# Patient Record
Sex: Female | Born: 2000 | Race: White | Hispanic: Yes | Marital: Single | Smoking: Never smoker
Health system: Southern US, Community
[De-identification: ages and names within clinical notes are randomized; demographics above are authoritative.]

---
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
## 2021-01-02 ENCOUNTER — Ambulatory Visit (INDEPENDENT_AMBULATORY_CARE_PROVIDER_SITE_OTHER): Payer: Medicaid Other | Admitting: Family Medicine

## 2021-01-02 ENCOUNTER — Other Ambulatory Visit: Payer: Self-pay

## 2021-01-02 VITALS — BP 108/59 | HR 79 | Wt 154.8 lb

## 2021-01-02 DIAGNOSIS — Z3492 Encounter for supervision of normal pregnancy, unspecified, second trimester: Secondary | ICD-10-CM

## 2021-01-02 DIAGNOSIS — Z98891 History of uterine scar from previous surgery: Secondary | ICD-10-CM

## 2021-01-02 NOTE — Progress Notes (Signed)
   PRENATAL VISIT NOTE  Subjective:  Annette Brock is a 20 y.o. G2P1001 at [redacted]w[redacted]d being seen today for ongoing prenatal care.  She is currently monitored for the following issues for this low-risk pregnancy and has Supervision of low-risk pregnancy and History of C-section on their problem list.  Patient reports no complaints.  Contractions: Not present. Vag. Bleeding: None.  Movement: Present. Denies leaking of fluid.   The following portions of the patient's history were reviewed and updated as appropriate: allergies, current medications, past family history, past medical history, past social history, past surgical history and problem list.   Objective:   Vitals:   01/02/21 1345  BP: (!) 108/59  Pulse: 79  Weight: 154 lb 12.8 oz (70.2 kg)    Fetal Status: Fetal Heart Rate (bpm): 154 Fundal Height: 22 cm Movement: Present     General:  Alert, oriented and cooperative. Patient is in no acute distress.  Skin: Skin is warm and dry. No rash noted.   Cardiovascular: Normal heart rate noted  Respiratory: Normal respiratory effort, no problems with respiration noted  Abdomen: Soft, gravid, appropriate for gestational age.  Pain/Pressure: Absent     Pelvic: Cervical exam deferred        Extremities: Normal range of motion.  Edema: None  Mental Status: Normal mood and affect. Normal behavior. Normal judgment and thought content.   Assessment and Plan:  Pregnancy: G2P1001 at [redacted]w[redacted]d 1. Encounter for supervision of low-risk pregnancy in second trimester Continue routine prenatal care.   2. History of C-section OP at 9 cm with NRFHR--desires TOLAC  General obstetric precautions including but not limited to vaginal bleeding, contractions, leaking of fluid and fetal movement were reviewed in detail with the patient. Please refer to After Visit Summary for other counseling recommendations.   Return in 4 weeks (on 01/30/2021) for virtual.  Future Appointments  Date Time Provider  Department Center  01/30/2021 10:35 AM Bernerd Limbo, CNM [facility]    Reva Bores, MD

## 2021-01-02 NOTE — Progress Notes (Signed)
Patient stated that she feels baby move and denies any pain or vaginal bleeding. No questions or concerns.   Dawayne Patricia, CMA   01/02/21

## 2021-01-02 NOTE — Patient Instructions (Signed)

## 2021-01-06 ENCOUNTER — Other Ambulatory Visit: Payer: Self-pay

## 2021-01-06 ENCOUNTER — Inpatient Hospital Stay (HOSPITAL_COMMUNITY)
Admission: AD | Admit: 2021-01-06 | Discharge: 2021-01-06 | Disposition: A | Discharge status: Home / Self Care | Payer: Medicaid Other | Attending: Obstetrics & Gynecology | Admitting: Obstetrics & Gynecology

## 2021-01-06 ENCOUNTER — Encounter (HOSPITAL_COMMUNITY): Payer: Self-pay | Admitting: Obstetrics & Gynecology

## 2021-01-06 DIAGNOSIS — O26892 Other specified pregnancy related conditions, second trimester: Secondary | ICD-10-CM | POA: Diagnosis present

## 2021-01-06 DIAGNOSIS — R1030 Lower abdominal pain, unspecified: Secondary | ICD-10-CM | POA: Insufficient documentation

## 2021-01-06 DIAGNOSIS — Z3A20 20 weeks gestation of pregnancy: Secondary | ICD-10-CM

## 2021-01-06 DIAGNOSIS — Z87891 Personal history of nicotine dependence: Secondary | ICD-10-CM | POA: Insufficient documentation

## 2021-01-06 DIAGNOSIS — Z8616 Personal history of COVID-19: Secondary | ICD-10-CM | POA: Insufficient documentation

## 2021-01-06 DIAGNOSIS — O2342 Unspecified infection of urinary tract in pregnancy, second trimester: Secondary | ICD-10-CM | POA: Diagnosis not present

## 2021-01-06 DIAGNOSIS — Z79899 Other long term (current) drug therapy: Secondary | ICD-10-CM | POA: Insufficient documentation

## 2021-01-06 DIAGNOSIS — R300 Dysuria: Secondary | ICD-10-CM | POA: Insufficient documentation

## 2021-01-06 DIAGNOSIS — R350 Frequency of micturition: Secondary | ICD-10-CM | POA: Diagnosis not present

## 2021-01-06 LAB — CBC WITH DIFFERENTIAL/PLATELET
Abs Immature Granulocytes: 0.07 10*3/uL (ref 0.00–0.07)
Basophils Absolute: 0.1 10*3/uL (ref 0.0–0.1)
Basophils Relative: 0 %
Eosinophils Absolute: 0.1 10*3/uL (ref 0.0–0.5)
Eosinophils Relative: 0 %
HCT: 35.1 % — ABNORMAL LOW (ref 36.0–46.0)
Hemoglobin: 11.9 g/dL — ABNORMAL LOW (ref 12.0–15.0)
Immature Granulocytes: 1 %
Lymphocytes Relative: 17 %
Lymphs Abs: 2.5 10*3/uL (ref 0.7–4.0)
MCH: 30.7 pg (ref 26.0–34.0)
MCHC: 33.9 g/dL (ref 30.0–36.0)
MCV: 90.5 fL (ref 80.0–100.0)
Monocytes Absolute: 1 10*3/uL (ref 0.1–1.0)
Monocytes Relative: 7 %
Neutro Abs: 10.9 10*3/uL — ABNORMAL HIGH (ref 1.7–7.7)
Neutrophils Relative %: 75 %
Platelets: 358 10*3/uL (ref 150–400)
RBC: 3.88 MIL/uL (ref 3.87–5.11)
RDW: 12.7 % (ref 11.5–15.5)
WBC: 14.5 10*3/uL — ABNORMAL HIGH (ref 4.0–10.5)
nRBC: 0 % (ref 0.0–0.2)

## 2021-01-06 LAB — URINALYSIS, ROUTINE W REFLEX MICROSCOPIC
Bilirubin Urine: NEGATIVE
Glucose, UA: NEGATIVE mg/dL
Hgb urine dipstick: NEGATIVE
Ketones, ur: NEGATIVE mg/dL
Nitrite: POSITIVE — AB
Protein, ur: 100 mg/dL — AB
Specific Gravity, Urine: 1.017 (ref 1.005–1.030)
WBC, UA: >50 WBC/hpf — ABNORMAL HIGH (ref 0–5)
pH: 7 (ref 5.0–8.0)

## 2021-01-06 MED ORDER — PHENAZOPYRIDINE HCL 200 MG PO TABS: 200.0000 mg | ORAL_TABLET | Freq: Three times a day (TID) | ORAL | 0 refills | Status: AC

## 2021-01-06 MED ORDER — ACETAMINOPHEN 500 MG PO TABS
1000.0000 mg | ORAL_TABLET | Freq: Once | ORAL | Status: AC
  Administered 2021-01-06: 1000 mg via ORAL
  Filled 2021-01-06: qty 2

## 2021-01-06 MED ORDER — CEFADROXIL 500 MG PO CAPS: 500.0000 mg | ORAL_CAPSULE | Freq: Two times a day (BID) | ORAL | 0 refills | Status: AC

## 2021-01-06 NOTE — MAU Provider Note (Signed)
History     CSN: [id number]  Arrival date and time: 01/06/21 1907   Event Date/Time   First Provider Initiated Contact with Patient 01/06/21 2012      Chief Complaint  Patient presents with   Dysuria    HPI Annette Brock is a 20 y.o. G2P1001 at 39w3dwho presents with flank pain and dysuria. She states it started yesterday and has progressively gotten worse. She rates the flank pain a 10/10 and has not tried anything for the pain. She also reports intermittent lower abdominal pain that she rates a 6/10. She reports she is having urinary frequency and feeling like she still needs to urinate immediately after she goes. She denies any bleeding or leaking. Reports feeling movement.   OB History     Gravida  2   Para  1   Term  1   Preterm      AB      Living  1      SAB      IAB      Ectopic      Multiple  0   Live Births  1           Past Medical History:  Diagnosis Date   Chlamydia infection affecting pregnancy 09/21/2020   COVID-19 02/2019   History of COVID-19 10/03/2020   02/2019    Past Surgical History:  Procedure Laterality Date   CESAREAN SECTION N/A 03/06/2019   Procedure: CESAREAN SECTION;  Surgeon: OJanyth Pupa DO;  [facility];  Service: Obstetrics;  Laterality: N/A;    Family History  Problem Relation Age of Onset   Hypertension Mother    Diabetes Mother     Social History   Tobacco Use   Smoking status: Never    Passive exposure: Yes   Smokeless tobacco: Never  Vaping Use   Vaping Use: Former   Quit date: 09/03/2020   Substances: Nicotine  Substance Use Topics   Alcohol use: No   Drug use: No    Allergies:  Allergies  Allergen Reactions   Mushroom Extract Complex     Medications Prior to Admission  Medication Sig Dispense Refill Last Dose   acetaminophen (TYLENOL) 325 MG tablet Take 2 tablets (650 mg total) by mouth every 6 (six) hours as needed for mild pain or moderate pain (temperature >  101.5.). (Patient not taking: Reported on 01/02/2021)      Blood Pressure Monitoring (BLOOD PRESSURE KIT) DEVI 1 Device by Does not apply route as needed. (Patient not taking: Reported on 01/02/2021) 1 each 0    Prenatal Vit-Fe Fumarate-FA (PRENATAL MULTIVITAMIN) TABS tablet Take 1 tablet by mouth daily at 12 noon.      promethazine (PHENERGAN) 25 MG tablet Take 1 tablet (25 mg total) by mouth every 6 (six) hours as needed for nausea or vomiting. 30 tablet 1     Review of Systems  Constitutional: Negative.  Negative for fatigue and fever.  HENT: Negative.    Respiratory: Negative.  Negative for shortness of breath.   Cardiovascular: Negative.  Negative for chest pain.  Gastrointestinal:  Positive for abdominal pain. Negative for constipation, diarrhea, nausea and vomiting.  Genitourinary:  Positive for dysuria, frequency and urgency. Negative for hematuria, vaginal bleeding and vaginal discharge.  Musculoskeletal:  Positive for back pain.  Neurological: Negative.  Negative for dizziness and headaches.  Physical Exam   Blood pressure 122/64, pulse 94, temperature 98.4 F (36.9 C), temperature source Oral, resp. rate  19, height 5' 3"  (1.6 m), weight 69.9 kg, last menstrual period 07/27/2020, SpO2 100 %, unknown if currently breastfeeding.  Physical Exam Vitals and nursing note reviewed.  Constitutional:      General: She is not in acute distress.    Appearance: She is well-developed.  HENT:     Head: Normocephalic.  Eyes:     Pupils: Pupils are equal, round, and reactive to light.  Cardiovascular:     Rate and Rhythm: Normal rate and regular rhythm.     Heart sounds: Normal heart sounds.  Pulmonary:     Effort: Pulmonary effort is normal. No respiratory distress.     Breath sounds: Normal breath sounds.  Abdominal:     General: Bowel sounds are normal. There is no distension.     Palpations: Abdomen is soft.     Tenderness: There is no abdominal tenderness. There is no right CVA  tenderness or left CVA tenderness.  Skin:    General: Skin is warm and dry.  Neurological:     Mental Status: She is alert and oriented to person, place, and time.  Psychiatric:        Mood and Affect: Mood normal.        Behavior: Behavior normal.        Thought Content: Thought content normal.        Judgment: Judgment normal.    FHT: 150 bpm  Cervix: closed/thick/posterior  MAU Course  Procedures Results for orders placed or performed during the hospital encounter of 01/06/21 (from the past 24 hour(s))  Urinalysis, Routine w reflex microscopic Urine, Clean Catch     Status: Abnormal   Collection Time: 01/06/21  8:01 PM  Result Value Ref Range   Color, Urine YELLOW YELLOW   APPearance CLOUDY (A) CLEAR   Specific Gravity, Urine 1.017 1.005 - 1.030   pH 7.0 5.0 - 8.0   Glucose, UA NEGATIVE NEGATIVE mg/dL   Hgb urine dipstick NEGATIVE NEGATIVE   Bilirubin Urine NEGATIVE NEGATIVE   Ketones, ur NEGATIVE NEGATIVE mg/dL   Protein, ur 100 (A) NEGATIVE mg/dL   Nitrite POSITIVE (A) NEGATIVE   Leukocytes,Ua LARGE (A) NEGATIVE   RBC / HPF 6-10 0 - 5 RBC/hpf   WBC, UA >50 (H) 0 - 5 WBC/hpf   Bacteria, UA MANY (A) NONE SEEN   Squamous Epithelial / LPF 6-10 0 - 5   WBC Clumps PRESENT   CBC with Differential/Platelet     Status: Abnormal   Collection Time: 01/06/21  8:46 PM  Result Value Ref Range   WBC 14.5 (H) 4.0 - 10.5 K/uL   RBC 3.88 3.87 - 5.11 MIL/uL   Hemoglobin 11.9 (L) 12.0 - 15.0 g/dL   HCT 35.1 (L) 36.0 - 46.0 %   MCV 90.5 80.0 - 100.0 fL   MCH 30.7 26.0 - 34.0 pg   MCHC 33.9 30.0 - 36.0 g/dL   RDW 12.7 11.5 - 15.5 %   Platelets 358 150 - 400 K/uL   nRBC 0.0 0.0 - 0.2 %   Neutrophils Relative % 75 %   Neutro Abs 10.9 (H) 1.7 - 7.7 K/uL   Lymphocytes Relative 17 %   Lymphs Abs 2.5 0.7 - 4.0 K/uL   Monocytes Relative 7 %   Monocytes Absolute 1.0 0.1 - 1.0 K/uL   Eosinophils Relative 0 %   Eosinophils Absolute 0.1 0.0 - 0.5 K/uL   Basophils Relative 0 %    Basophils Absolute 0.1 0.0 - 0.1 K/uL  Immature Granulocytes 1 %   Abs Immature Granulocytes 0.07 0.00 - 0.07 K/uL    MDM UA, UC Tylenol PO- patient reports improvement of pain CBC with Diff Cervix closed/thick, no signs of preterm labor  Assessment and Plan   1. Urinary tract infection in mother during second trimester of pregnancy   2. [redacted] weeks gestation of pregnancy    -Discharge home in stable condition -Rx for duracef and pyridium sent to patient's pharmacy -Pyelonephritis precautions discussed -Patient advised to follow-up with [facility] as scheduled for prenatal care -Patient may return to MAU as needed or if her condition were to change or worsen   Wende Mott CNM 01/06/2021, 8:12 PM

## 2021-01-06 NOTE — Discharge Instructions (Signed)

## 2021-01-06 NOTE — MAU Note (Signed)
..  Annette Brock is a 20 y.o. at [redacted]w[redacted]d here in MAU reporting: right sided back pain and lower abdominal pain that began yesterday and has gotten worse today. Has not taken anything for the pain. Back pain hurts more when she moves and feels sharp that is constant. The abdominal pain is constant that hurts more when she goes to the bathroom. She has had urinary frequency and pressure. +FM. Denies vaginal bleeding or leaking of fluid.  Pain score: back 10/10; abdominal 7/10 Vitals:   01/06/21 1946  BP: 122/64  Pulse: 94  Resp: 19  Temp: 98.4 F (36.9 C)  SpO2: 100%     FHT:150 Lab orders placed from triage: UA

## 2021-01-09 LAB — CULTURE, OB URINE: Culture: >=100000 — AB

## 2021-01-30 ENCOUNTER — Telehealth: Payer: Medicaid Other | Admitting: Certified Nurse Midwife

## 2021-02-25 ENCOUNTER — Other Ambulatory Visit: Payer: Self-pay | Admitting: *Deleted

## 2021-02-25 DIAGNOSIS — Z3490 Encounter for supervision of normal pregnancy, unspecified, unspecified trimester: Secondary | ICD-10-CM

## 2021-02-27 ENCOUNTER — Ambulatory Visit (INDEPENDENT_AMBULATORY_CARE_PROVIDER_SITE_OTHER): Payer: Medicaid Other | Admitting: Family Medicine

## 2021-02-27 ENCOUNTER — Other Ambulatory Visit: Payer: Medicaid Other

## 2021-02-27 ENCOUNTER — Encounter: Payer: Self-pay | Admitting: Family Medicine

## 2021-02-27 ENCOUNTER — Other Ambulatory Visit: Payer: Self-pay

## 2021-02-27 VITALS — BP 113/73 | HR 90 | Wt 172.9 lb

## 2021-02-27 DIAGNOSIS — Z23 Encounter for immunization: Secondary | ICD-10-CM | POA: Diagnosis not present

## 2021-02-27 DIAGNOSIS — Z3490 Encounter for supervision of normal pregnancy, unspecified, unspecified trimester: Secondary | ICD-10-CM

## 2021-02-27 DIAGNOSIS — Z98891 History of uterine scar from previous surgery: Secondary | ICD-10-CM

## 2021-02-27 DIAGNOSIS — Z3493 Encounter for supervision of normal pregnancy, unspecified, third trimester: Secondary | ICD-10-CM

## 2021-02-27 NOTE — Patient Instructions (Signed)

## 2021-02-27 NOTE — Progress Notes (Signed)
° °  Subjective:  Annette Brock is a 20 y.o. G2P1001 at [redacted]w[redacted]d being seen today for ongoing prenatal care.  She is currently monitored for the following issues for this low-risk pregnancy and has Supervision of low-risk pregnancy and History of C-section on their problem list.  Patient reports no complaints.  Contractions: Not present. Vag. Bleeding: None.  Movement: Present. Denies leaking of fluid.   The following portions of the patient's history were reviewed and updated as appropriate: allergies, current medications, past family history, past medical history, past social history, past surgical history and problem list. Problem list updated.  Objective:   Vitals:   02/27/21 0836  BP: 113/73  Pulse: 90  Weight: 172 lb 14.4 oz (78.4 kg)    Fetal Status: Fetal Heart Rate (bpm): 136   Movement: Present     General:  Alert, oriented and cooperative. Patient is in no acute distress.  Skin: Skin is warm and dry. No rash noted.   Cardiovascular: Normal heart rate noted  Respiratory: Normal respiratory effort, no problems with respiration noted  Abdomen: Soft, gravid, appropriate for gestational age. Pain/Pressure: Absent     Pelvic: Vag. Bleeding: None     Cervical exam deferred        Extremities: Normal range of motion.  Edema: None  Mental Status: Normal mood and affect. Normal behavior. Normal judgment and thought content.   Urinalysis:      Assessment and Plan:  Pregnancy: G2P1001 at [redacted]w[redacted]d  1. Encounter for supervision of low-risk pregnancy in third trimester BP and FHR normal 28 wk labs today TDaP given  2. History of C-section Desires TOLAC Consented, form signed  Preterm labor symptoms and general obstetric precautions including but not limited to vaginal bleeding, contractions, leaking of fluid and fetal movement were reviewed in detail with the patient. Please refer to After Visit Summary for other counseling recommendations.  Return in 2 weeks (on  03/13/2021) for [facility], ob visit.   Venora Maples, MD

## 2021-02-28 LAB — GLUCOSE TOLERANCE, 2 HOURS W/ 1HR
Glucose, 1 hour: 109 mg/dL (ref 70–179)
Glucose, 2 hour: 107 mg/dL (ref 70–152)
Glucose, Fasting: 82 mg/dL (ref 70–91)

## 2021-02-28 LAB — RPR: RPR Ser Ql: NONREACTIVE

## 2021-02-28 LAB — CBC
Hematocrit: 31.6 % — ABNORMAL LOW (ref 34.0–46.6)
Hemoglobin: 10.3 g/dL — ABNORMAL LOW (ref 11.1–15.9)
MCH: 29 pg (ref 26.6–33.0)
MCHC: 32.6 g/dL (ref 31.5–35.7)
MCV: 89 fL (ref 79–97)
Platelets: 398 10*3/uL (ref 150–450)
RBC: 3.55 x10E6/uL — ABNORMAL LOW (ref 3.77–5.28)
RDW: 12.3 % (ref 11.7–15.4)
WBC: 9.9 10*3/uL (ref 3.4–10.8)

## 2021-02-28 LAB — HIV ANTIBODY (ROUTINE TESTING W REFLEX): HIV Screen 4th Generation wRfx: NONREACTIVE

## 2021-03-13 ENCOUNTER — Ambulatory Visit (INDEPENDENT_AMBULATORY_CARE_PROVIDER_SITE_OTHER): Payer: Medicaid Other | Admitting: Certified Nurse Midwife

## 2021-03-13 ENCOUNTER — Other Ambulatory Visit: Payer: Self-pay

## 2021-03-13 VITALS — BP 126/67 | HR 89 | Wt 174.0 lb

## 2021-03-13 DIAGNOSIS — Z3493 Encounter for supervision of normal pregnancy, unspecified, third trimester: Secondary | ICD-10-CM

## 2021-03-13 DIAGNOSIS — Z3A29 29 weeks gestation of pregnancy: Secondary | ICD-10-CM

## 2021-03-13 DIAGNOSIS — Z98891 History of uterine scar from previous surgery: Secondary | ICD-10-CM

## 2021-03-13 NOTE — Progress Notes (Signed)
° °  PRENATAL VISIT NOTE  Subjective:  Annette Brock is a 20 y.o. G2P1001 at [redacted]w[redacted]d being seen today for ongoing prenatal care.  She is currently monitored for the following issues for this low-risk pregnancy and has Supervision of low-risk pregnancy and History of C-section on their problem list.  Patient reports no complaints. Feels like the baby turned to head down in the past week. Feeling more kicks in her upper abdomen, more pressure in her pelvis and able to breathe better. Contractions: Not present. Vag. Bleeding: None.  Movement: Present. Denies leaking of fluid.   The following portions of the patient's history were reviewed and updated as appropriate: allergies, current medications, past family history, past medical history, past social history, past surgical history and problem list.   Objective:   Vitals:   03/13/21 0835  BP: 126/67  Pulse: 89  Weight: 174 lb (78.9 kg)   Fetal Status: Fetal Heart Rate (bpm): 135 Fundal Height: 30 cm Movement: Present  Presentation: Vertex  General:  Alert, oriented and cooperative. Patient is in no acute distress.  Skin: Skin is warm and dry. No rash noted.   Cardiovascular: Normal heart rate noted  Respiratory: Normal respiratory effort, no problems with respiration noted  Abdomen: Soft, gravid, appropriate for gestational age.  Pain/Pressure: Present     Pelvic: Cervical exam deferred      Leopold's = baby vertex  Extremities: Normal range of motion.  Edema: None  Mental Status: Normal mood and affect. Normal behavior. Normal judgment and thought content.   Assessment and Plan:  Pregnancy: G2P1001 at [redacted]w[redacted]d 1. Supervision of low-risk pregnancy, third trimester - Doing well, feeling regular and vigorous fetal movement   2. [redacted] weeks gestation of pregnancy - Routine OB care, reviewed third trimester labs  3. History of cesarean section - Planning TOLAC, signed consent with MD already  Preterm labor symptoms and general  obstetric precautions including but not limited to vaginal bleeding, contractions, leaking of fluid and fetal movement were reviewed in detail with the patient. Please refer to After Visit Summary for other counseling recommendations.   Return in about 2 weeks (around 03/27/2021) for LOB, IN-PERSON.  Future Appointments  Date Time Provider Department Center  03/26/2021  2:55 PM Crissie Reese, Mary Sella, MD [facility]    Bernerd Limbo, CNM

## 2021-03-13 NOTE — Progress Notes (Signed)
Patient reports "off and on" pelvis pressure

## 2021-03-17 NOTE — L&D Delivery Note (Signed)
OB/GYN Faculty Practice Delivery Note  Annette Brock is a 21 y.o. [id number] s/p SVD at [redacted]w[redacted]d. She was admitted for SOL/TOLAC.   ROM: 4h 19m with clear fluid GBS Status:  Negative/-- (02/09 1615) Maximum Maternal Temperature: 98.71F  Labor Progress: Initial SVE: 3.5/50/-2. She then progressed to complete with assistance of AROM.   Delivery Date/Time: 2/24 at 1106  Delivery: Called to room and patient was complete and pushing. Head delivered midline OA. Loose nuchal cord present and delivered through. Shoulder and body delivered in usual fashion. Infant with spontaneous cry, placed on mother's abdomen, dried and stimulated. Cord clamped x 2 after 1-minute delay, and cut by FOB. Cord blood drawn. Placenta delivered spontaneously with gentle cord traction. Fundus firm with massage and Pitocin. Labia, perineum, vagina, and cervix inspected with bilateral peri-urethral lacerations with the right including a superior labial extension. The R laceration was repaired with 4.0 monocryl in usual fashion. L was not repaired and hemostatic. Urinary catheter was in place during repair and easily removed afterwards. Delivery was done in conjunction with Dr. Levin Erp, MD [facility] PGY-1.  Baby Weight: pending  Placenta: 3 vessel, intact. Sent to L&D Complications: None Lacerations: bilateral  EBL: 224 mL Analgesia: Epidural   Infant:  APGAR (1 MIN): 9   APGAR (5 MINS): 9    Leticia Penna, DO  OB Family Medicine Fellow, [facility], [facility] 05/10/2021, 12:53 PM

## 2021-03-26 ENCOUNTER — Encounter: Payer: Medicaid Other | Admitting: Family Medicine

## 2021-04-04 ENCOUNTER — Other Ambulatory Visit: Payer: Self-pay

## 2021-04-04 ENCOUNTER — Ambulatory Visit (INDEPENDENT_AMBULATORY_CARE_PROVIDER_SITE_OTHER): Payer: Medicaid Other | Admitting: Family Medicine

## 2021-04-04 VITALS — BP 127/61 | HR 111 | Wt 177.8 lb

## 2021-04-04 DIAGNOSIS — Z98891 History of uterine scar from previous surgery: Secondary | ICD-10-CM

## 2021-04-04 DIAGNOSIS — Z3493 Encounter for supervision of normal pregnancy, unspecified, third trimester: Secondary | ICD-10-CM

## 2021-04-04 DIAGNOSIS — F5089 Other specified eating disorder: Secondary | ICD-10-CM

## 2021-04-04 NOTE — Progress Notes (Signed)
° °  PRENATAL VISIT NOTE  Subjective:  Annette Brock is a 21 y.o. G2P1001 at [redacted]w[redacted]d being seen today for ongoing prenatal care.  She is currently monitored for the following issues for this low-risk pregnancy and has Supervision of low-risk pregnancy and History of C-section on their problem list.  Patient reports no complaints.  Contractions: Not present. Vag. Bleeding: None.  Movement: Present. Denies leaking of fluid.   The following portions of the patient's history were reviewed and updated as appropriate: allergies, current medications, past family history, past medical history, past social history, past surgical history and problem list.   Objective:   Vitals:   04/04/21 1400  BP: 127/61  Pulse: (!) 111  Weight: 177 lb 12.8 oz (80.6 kg)    Fetal Status: Fetal Heart Rate (bpm): 143 Fundal Height: 32 cm Movement: Present     General:  Alert, oriented and cooperative. Patient is in no acute distress.  Skin: Skin is warm and dry. No rash noted.   Cardiovascular: Normal heart rate noted  Respiratory: Normal respiratory effort, no problems with respiration noted  Abdomen: Soft, gravid, appropriate for gestational age.  Pain/Pressure: Present     Pelvic: Cervical exam deferred        Extremities: Normal range of motion.  Edema: None  Mental Status: Normal mood and affect. Normal behavior. Normal judgment and thought content.   Assessment and Plan:  Pregnancy: G2P1001 at [redacted]w[redacted]d 1. Encounter for supervision of low-risk pregnancy in third trimester Continue routine prenatal care.  2. History of C-section For TOLAC  3. Pica Eating a lot of ice--10.3 at last check, recheck in case needs additional oral or IV supplementation - CBC  Preterm labor symptoms (reviewed at length) and general obstetric precautions including but not limited to vaginal bleeding, contractions, leaking of fluid and fetal movement were reviewed in detail with the patient. Please refer to After Visit  Summary for other counseling recommendations.   Return in 2 weeks (on 04/18/2021) for [facility].  No future appointments.  Reva Bores, MD

## 2021-04-04 NOTE — Patient Instructions (Signed)

## 2021-04-05 LAB — CBC
Hematocrit: 30.7 % — ABNORMAL LOW (ref 34.0–46.6)
Hemoglobin: 10.2 g/dL — ABNORMAL LOW (ref 11.1–15.9)
MCH: 27.3 pg (ref 26.6–33.0)
MCHC: 33.2 g/dL (ref 31.5–35.7)
MCV: 82 fL (ref 79–97)
Platelets: 391 10*3/uL (ref 150–450)
RBC: 3.73 x10E6/uL — ABNORMAL LOW (ref 3.77–5.28)
RDW: 13.2 % (ref 11.7–15.4)
WBC: 9.6 10*3/uL (ref 3.4–10.8)

## 2021-04-10 ENCOUNTER — Encounter: Payer: Self-pay | Admitting: Family Medicine

## 2021-04-11 ENCOUNTER — Inpatient Hospital Stay (HOSPITAL_COMMUNITY)
Admission: AD | Admit: 2021-04-11 | Discharge: 2021-04-11 | Disposition: A | Discharge status: Home / Self Care | Payer: Medicaid Other | Attending: Obstetrics and Gynecology | Admitting: Obstetrics and Gynecology

## 2021-04-11 ENCOUNTER — Other Ambulatory Visit: Payer: Self-pay

## 2021-04-11 ENCOUNTER — Encounter (HOSPITAL_COMMUNITY): Payer: Self-pay | Admitting: Obstetrics and Gynecology

## 2021-04-11 DIAGNOSIS — Z3A34 34 weeks gestation of pregnancy: Secondary | ICD-10-CM | POA: Diagnosis not present

## 2021-04-11 DIAGNOSIS — O2343 Unspecified infection of urinary tract in pregnancy, third trimester: Secondary | ICD-10-CM | POA: Insufficient documentation

## 2021-04-11 DIAGNOSIS — N390 Urinary tract infection, site not specified: Secondary | ICD-10-CM | POA: Diagnosis not present

## 2021-04-11 LAB — URINALYSIS, ROUTINE W REFLEX MICROSCOPIC
Bilirubin Urine: NEGATIVE
Glucose, UA: NEGATIVE mg/dL
Hgb urine dipstick: NEGATIVE
Ketones, ur: 80 mg/dL — AB
Nitrite: POSITIVE — AB
Protein, ur: NEGATIVE mg/dL
Specific Gravity, Urine: 1.016 (ref 1.005–1.030)
WBC, UA: >50 WBC/hpf — ABNORMAL HIGH (ref 0–5)
pH: 6 (ref 5.0–8.0)

## 2021-04-11 MED ORDER — CEFADROXIL 500 MG PO CAPS: 500.0000 mg | ORAL_CAPSULE | Freq: Two times a day (BID) | ORAL | 0 refills | Status: AC

## 2021-04-11 MED ORDER — PHENAZOPYRIDINE HCL 200 MG PO TABS: 200.0000 mg | ORAL_TABLET | Freq: Three times a day (TID) | ORAL | 0 refills | Status: AC

## 2021-04-11 NOTE — MAU Note (Signed)
...  Annette Brock is a 21 y.o. at [redacted]w[redacted]d here in MAU reporting: CTX since Tuesday that she states slowed down yesterday but have picked up today. She states her CTX are currently 5-6 minutes apart. She has been trying to stay hydrated and has taken Tylenol but has not had any relief. No VB or LOF. Endorses decreased fetal movement since Tuesday.   Last dose: 1000 mg of Tylenol around noon  Pain score:  6/10 lower abdomen  FHT: 155 initial external Lab orders placed from triage: UA

## 2021-04-11 NOTE — Discharge Instructions (Signed)

## 2021-04-11 NOTE — MAU Provider Note (Signed)
History     CSN: [id number]  Arrival date and time: 04/11/21 1626   Event Date/Time   First Provider Initiated Contact with Patient 04/11/21 1659      Chief Complaint  Patient presents with   Contractions   HPI Annette Brock is a 21 y.o. G2P1001 at 30w0dwho presents with lower abdominal cramping for the last couple of weeks. She states it has been consistent in the level of pain. She rates the pain a 4/10. She denies any bleeding or leaking. She reports normal fetal movement. She denies any constipation or diarrhea. She denies any urinary complaints.   OB History     Gravida  2   Para  1   Term  1   Preterm      AB      Living  1      SAB      IAB      Ectopic      Multiple  0   Live Births  1           Past Medical History:  Diagnosis Date   Chlamydia infection affecting pregnancy 09/21/2020   COVID-19 02/2019   History of COVID-19 10/03/2020   02/2019    Past Surgical History:  Procedure Laterality Date   CESAREAN SECTION N/A 03/06/2019   Procedure: CESAREAN SECTION;  Surgeon: OJanyth Pupa DO;  [facility];  Service: Obstetrics;  Laterality: N/A;    Family History  Problem Relation Age of Onset   Hypertension Mother    Diabetes Mother     Social History   Tobacco Use   Smoking status: Never    Passive exposure: Yes   Smokeless tobacco: Never  Vaping Use   Vaping Use: Former   Quit date: 09/03/2020   Substances: Nicotine  Substance Use Topics   Alcohol use: No   Drug use: No    Allergies:  Allergies  Allergen Reactions   Mushroom Extract Complex     Medications Prior to Admission  Medication Sig Dispense Refill Last Dose   acetaminophen (TYLENOL) 500 MG tablet Take 500 mg by mouth every 6 (six) hours as needed.   04/11/2021 at 1200   Prenatal Vit-Fe Fumarate-FA (PRENATAL MULTIVITAMIN) TABS tablet Take 1 tablet by mouth daily at 12 noon.   04/10/2021   Blood Pressure Monitoring (BLOOD PRESSURE KIT) DEVI 1  Device by Does not apply route as needed. (Patient not taking: Reported on 01/02/2021) 1 each 0     Review of Systems  Constitutional: Negative.  Negative for fatigue and fever.  HENT: Negative.    Respiratory: Negative.  Negative for shortness of breath.   Cardiovascular: Negative.  Negative for chest pain.  Gastrointestinal:  Positive for abdominal pain. Negative for constipation, diarrhea, nausea and vomiting.  Genitourinary: Negative.  Negative for dysuria, vaginal bleeding and vaginal discharge.  Neurological: Negative.  Negative for dizziness and headaches.  Physical Exam   Blood pressure 117/66, pulse 98, temperature 98.1 F (36.7 C), temperature source Oral, resp. rate 15, last menstrual period 07/27/2020, SpO2 98 %, unknown if currently breastfeeding.  Physical Exam Vitals and nursing note reviewed.  Constitutional:      General: She is not in acute distress.    Appearance: She is well-developed.  HENT:     Head: Normocephalic.  Eyes:     Pupils: Pupils are equal, round, and reactive to light.  Cardiovascular:     Rate and Rhythm: Normal rate and regular rhythm.  Heart sounds: Normal heart sounds.  Pulmonary:     Effort: Pulmonary effort is normal. No respiratory distress.     Breath sounds: Normal breath sounds.  Abdominal:     General: Bowel sounds are normal. There is no distension.     Palpations: Abdomen is soft.     Tenderness: There is no abdominal tenderness.  Skin:    General: Skin is warm and dry.  Neurological:     Mental Status: She is alert and oriented to person, place, and time.  Psychiatric:        Mood and Affect: Mood normal.        Behavior: Behavior normal.        Thought Content: Thought content normal.        Judgment: Judgment normal.   Fetal Tracing:  Baseline: 130 Variability: moderate Accels: 15x15 Decels: none  Toco: none  Dilation: Closed Effacement (%): Thick Exam by:: Sharolyn Douglas, CNM  MAU Course   Procedures Results for orders placed or performed during the hospital encounter of 04/11/21 (from the past 24 hour(s))  Urinalysis, Routine w reflex microscopic Urine, Clean Catch     Status: Abnormal   Collection Time: 04/11/21  4:59 PM  Result Value Ref Range   Color, Urine YELLOW YELLOW   APPearance HAZY (A) CLEAR   Specific Gravity, Urine 1.016 1.005 - 1.030   pH 6.0 5.0 - 8.0   Glucose, UA NEGATIVE NEGATIVE mg/dL   Hgb urine dipstick NEGATIVE NEGATIVE   Bilirubin Urine NEGATIVE NEGATIVE   Ketones, ur 80 (A) NEGATIVE mg/dL   Protein, ur NEGATIVE NEGATIVE mg/dL   Nitrite POSITIVE (A) NEGATIVE   Leukocytes,Ua LARGE (A) NEGATIVE   RBC / HPF 0-5 0 - 5 RBC/hpf   WBC, UA >50 (H) 0 - 5 WBC/hpf   Bacteria, UA MANY (A) NONE SEEN   Squamous Epithelial / LPF 0-5 0 - 5   Mucus PRESENT     MDM UA, UC PO hydration  Assessment and Plan   1. Urinary tract infection in mother during third trimester of pregnancy   2. [redacted] weeks gestation of pregnancy    -Discharge home in stable condition -RX for duracef and pyridium sent to patient's pharmacy -Pyelonephritis precautions discussed -Patient advised to follow-up with OB as scheduled for prenatal care -Patient may return to MAU as needed or if her condition were to change or worsen   Wende Mott CNM 04/11/2021, 4:59 PM

## 2021-04-14 LAB — CULTURE, OB URINE: Culture: >=100000 — AB

## 2021-04-18 ENCOUNTER — Ambulatory Visit (INDEPENDENT_AMBULATORY_CARE_PROVIDER_SITE_OTHER): Payer: Medicaid Other

## 2021-04-18 ENCOUNTER — Other Ambulatory Visit: Payer: Self-pay

## 2021-04-18 VITALS — BP 123/71 | HR 102 | Wt 183.4 lb

## 2021-04-18 DIAGNOSIS — Z98891 History of uterine scar from previous surgery: Secondary | ICD-10-CM

## 2021-04-18 DIAGNOSIS — Z3493 Encounter for supervision of normal pregnancy, unspecified, third trimester: Secondary | ICD-10-CM

## 2021-04-18 DIAGNOSIS — Z3A35 35 weeks gestation of pregnancy: Secondary | ICD-10-CM

## 2021-04-18 NOTE — Progress Notes (Signed)
° °  PRENATAL VISIT NOTE  Subjective:  Annette Brock is a 21 y.o. G2P1001 at [redacted]w[redacted]d being seen today for ongoing prenatal care.  She is currently monitored for the following issues for this low-risk pregnancy and has Supervision of low-risk pregnancy and History of C-section on their problem list.  Patient reports no complaints.  Contractions: Not present. Vag. Bleeding: None.  Movement: Present. Denies leaking of fluid.   The following portions of the patient's history were reviewed and updated as appropriate: allergies, current medications, past family history, past medical history, past social history, past surgical history and problem list.   Objective:   Vitals:   04/18/21 1125  BP: 123/71  Pulse: (!) 102  Weight: 183 lb 6.4 oz (83.2 kg)    Fetal Status: Fetal Heart Rate (bpm): 143 Fundal Height: 36 cm Movement: Present     General:  Alert, oriented and cooperative. Patient is in no acute distress.  Skin: Skin is warm and dry. No rash noted.   Cardiovascular: Normal heart rate noted  Respiratory: Normal respiratory effort, no problems with respiration noted  Abdomen: Soft, gravid, appropriate for gestational age.  Pain/Pressure: Present     Pelvic: Cervical exam deferred        Extremities: Normal range of motion.  Edema: None  Mental Status: Normal mood and affect. Normal behavior. Normal judgment and thought content.   Assessment and Plan:  Pregnancy: G2P1001 at [redacted]w[redacted]d 1. Encounter for supervision of low-risk pregnancy in third trimester - Routine OB. Doing well. No concerns - Cultures next week - Anticipatory guidance for upcoming appointments provided  2. [redacted] weeks gestation of pregnancy   3. History of C-section - TOLAC consent signed   Preterm labor symptoms and general obstetric precautions including but not limited to vaginal bleeding, contractions, leaking of fluid and fetal movement were reviewed in detail with the patient. Please refer to After Visit  Summary for other counseling recommendations.   Return in about 1 week (around 04/25/2021).  Future Appointments  Date Time Provider Department Center  04/25/2021  3:15 PM Marylene Land, CNM [facility]    Brand Males, CNM

## 2021-04-25 ENCOUNTER — Other Ambulatory Visit (HOSPITAL_COMMUNITY)
Admission: RE | Admit: 2021-04-25 | Discharge: 2021-04-25 | Disposition: A | Discharge status: Home / Self Care | Payer: Medicaid Other | Source: Ambulatory Visit | Attending: Student | Admitting: Student

## 2021-04-25 ENCOUNTER — Ambulatory Visit (INDEPENDENT_AMBULATORY_CARE_PROVIDER_SITE_OTHER): Payer: Medicaid Other | Admitting: Student

## 2021-04-25 ENCOUNTER — Other Ambulatory Visit: Payer: Self-pay

## 2021-04-25 VITALS — BP 122/67 | HR 91 | Wt 182.0 lb

## 2021-04-25 DIAGNOSIS — Z3493 Encounter for supervision of normal pregnancy, unspecified, third trimester: Secondary | ICD-10-CM | POA: Diagnosis present

## 2021-04-25 DIAGNOSIS — Z3A36 36 weeks gestation of pregnancy: Secondary | ICD-10-CM

## 2021-04-25 LAB — OB RESULTS CONSOLE GC/CHLAMYDIA: Gonorrhea: NEGATIVE

## 2021-04-25 NOTE — Progress Notes (Signed)
° °  PRENATAL VISIT NOTE  Subjective:  Annette Brock is a 21 y.o. G2P1001 at [redacted]w[redacted]d being seen today for ongoing prenatal care.  She is currently monitored for the following issues for this low-risk pregnancy and has Supervision of low-risk pregnancy and History of C-section on their problem list.  Patient reports no complaints.  Contractions: Irritability. Vag. Bleeding: None.  Movement: Present. Denies leaking of fluid.   The following portions of the patient's history were reviewed and updated as appropriate: allergies, current medications, past family history, past medical history, past social history, past surgical history and problem list.   Objective:   Vitals:   04/25/21 1526  BP: 122/67  Pulse: 91  Weight: 182 lb (82.6 kg)    Fetal Status: Fetal Heart Rate (bpm): 157 Fundal Height: 37 cm Movement: Present  Presentation: Vertex  General:  Alert, oriented and cooperative. Patient is in no acute distress.  Skin: Skin is warm and dry. No rash noted.   Cardiovascular: Normal heart rate noted  Respiratory: Normal respiratory effort, no problems with respiration noted  Abdomen: Soft, gravid, appropriate for gestational age.  Pain/Pressure: Present     Pelvic: Cervical exam performed in the presence of a chaperone Dilation: 1      Extremities: Normal range of motion.  Edema: Trace  Mental Status: Normal mood and affect. Normal behavior. Normal judgment and thought content.   Assessment and Plan:  Pregnancy: G2P1001 at [redacted]w[redacted]d 1. Encounter for supervision of low-risk pregnancy in third trimester -still wants to try vaginal birth; she has signed VBAC form -confirmed vertex by [place] -recommend kick counts if patient is ever concerned about fetal movements.  - GC/Chlamydia probe amp ([facility])not at [facility] - Culture, beta strep (group b only) -discussed birth control patch; she has used in the past and wants to try Preterm labor symptoms and general obstetric precautions  including but not limited to vaginal bleeding, contractions, leaking of fluid and fetal movement were reviewed in detail with the patient. Please refer to After Visit Summary for other counseling recommendations.   Return in about 2 weeks (around 05/09/2021), or LROB with KK if possible.  No future appointments.  Marylene Land, CNM

## 2021-04-26 LAB — GC/CHLAMYDIA PROBE AMP (~~LOC~~) NOT AT ARMC
Chlamydia: NEGATIVE
Comment: NEGATIVE
Comment: NORMAL
Neisseria Gonorrhea: NEGATIVE

## 2021-04-29 LAB — CULTURE, BETA STREP (GROUP B ONLY): Strep Gp B Culture: NEGATIVE

## 2021-05-09 ENCOUNTER — Ambulatory Visit (INDEPENDENT_AMBULATORY_CARE_PROVIDER_SITE_OTHER): Payer: Medicaid Other

## 2021-05-09 ENCOUNTER — Other Ambulatory Visit: Payer: Self-pay

## 2021-05-09 VITALS — BP 121/75 | HR 90 | Wt 184.3 lb

## 2021-05-09 DIAGNOSIS — Z3493 Encounter for supervision of normal pregnancy, unspecified, third trimester: Secondary | ICD-10-CM

## 2021-05-09 DIAGNOSIS — Z3A38 38 weeks gestation of pregnancy: Secondary | ICD-10-CM

## 2021-05-09 DIAGNOSIS — Z98891 History of uterine scar from previous surgery: Secondary | ICD-10-CM

## 2021-05-09 NOTE — Progress Notes (Signed)
° °  PRENATAL VISIT NOTE  Subjective:  Annette Brock is a 21 y.o. G2P1001 at [redacted]w[redacted]d being seen today for ongoing prenatal care.  She is currently monitored for the following issues for this low-risk pregnancy and has Supervision of low-risk pregnancy and History of C-section on their problem list.  Patient reports  braxton hicks contractions .  Contractions: Irritability. Vag. Bleeding: None.  Movement: Present. Denies leaking of fluid.   The following portions of the patient's history were reviewed and updated as appropriate: allergies, current medications, past family history, past medical history, past social history, past surgical history and problem list.   Objective:   Vitals:   05/09/21 1606  BP: 121/75  Pulse: 90  Weight: 184 lb 4.8 oz (83.6 kg)    Fetal Status: Fetal Heart Rate (bpm): 133   Movement: Present  Presentation: Vertex  General:  Alert, oriented and cooperative. Patient is in no acute distress.  Skin: Skin is warm and dry. No rash noted.   Cardiovascular: Normal heart rate noted  Respiratory: Normal respiratory effort, no problems with respiration noted  Abdomen: Soft, gravid, appropriate for gestational age.  Pain/Pressure: Present     Pelvic: Cervical exam performed in the presence of a chaperone Dilation: 3.5 Effacement (%): 50 Station: -2  Extremities: Normal range of motion.  Edema: Trace  Mental Status: Normal mood and affect. Normal behavior. Normal judgment and thought content.   Assessment and Plan:  Pregnancy: G2P1001 at [redacted]w[redacted]d 1. Encounter for supervision of low-risk pregnancy in third trimester - Routine OB. Doing well. No concerns - Irregular braxton hicks contractions, mostly at night - Requests cervical exam today - Anticipatory guidance for upcoming appointments provided - Reviewed location of WCC and MAU  2. [redacted] weeks gestation of pregnancy - Endorses active fetal movement  3. History of c-section - TOLAC consent previously  signed  Term labor symptoms and general obstetric precautions including but not limited to vaginal bleeding, contractions, leaking of fluid and fetal movement were reviewed in detail with the patient. Please refer to After Visit Summary for other counseling recommendations.   Return in about 1 week (around 05/16/2021).  Future Appointments  Date Time Provider Department Center  05/16/2021  2:15 PM Ilina, Xu [facility]    Brand Males, CNM

## 2021-05-10 ENCOUNTER — Inpatient Hospital Stay (HOSPITAL_COMMUNITY)
Admission: AD | Admit: 2021-05-10 | Discharge: 2021-05-11 | DRG: 807 | Disposition: A | Discharge status: Home / Self Care | Payer: Medicaid Other | Attending: Obstetrics and Gynecology | Admitting: Obstetrics and Gynecology

## 2021-05-10 ENCOUNTER — Inpatient Hospital Stay (HOSPITAL_COMMUNITY): Payer: Medicaid Other | Admitting: Anesthesiology

## 2021-05-10 ENCOUNTER — Encounter (HOSPITAL_COMMUNITY): Payer: Self-pay | Admitting: Obstetrics and Gynecology

## 2021-05-10 DIAGNOSIS — Z20822 Contact with and (suspected) exposure to covid-19: Secondary | ICD-10-CM | POA: Diagnosis present

## 2021-05-10 DIAGNOSIS — Z8616 Personal history of COVID-19: Secondary | ICD-10-CM

## 2021-05-10 DIAGNOSIS — Z98891 History of uterine scar from previous surgery: Secondary | ICD-10-CM

## 2021-05-10 DIAGNOSIS — O34211 Maternal care for low transverse scar from previous cesarean delivery: Principal | ICD-10-CM | POA: Diagnosis present

## 2021-05-10 DIAGNOSIS — Z3A38 38 weeks gestation of pregnancy: Secondary | ICD-10-CM

## 2021-05-10 DIAGNOSIS — O26893 Other specified pregnancy related conditions, third trimester: Secondary | ICD-10-CM | POA: Diagnosis present

## 2021-05-10 DIAGNOSIS — O34219 Maternal care for unspecified type scar from previous cesarean delivery: Secondary | ICD-10-CM

## 2021-05-10 LAB — CBC
HCT: 32 % — ABNORMAL LOW (ref 36.0–46.0)
Hemoglobin: 10 g/dL — ABNORMAL LOW (ref 12.0–15.0)
MCH: 25.8 pg — ABNORMAL LOW (ref 26.0–34.0)
MCHC: 31.3 g/dL (ref 30.0–36.0)
MCV: 82.5 fL (ref 80.0–100.0)
Platelets: 368 10*3/uL (ref 150–400)
RBC: 3.88 MIL/uL (ref 3.87–5.11)
RDW: 15.9 % — ABNORMAL HIGH (ref 11.5–15.5)
WBC: 9.4 10*3/uL (ref 4.0–10.5)
nRBC: 0 % (ref 0.0–0.2)

## 2021-05-10 LAB — RPR: RPR Ser Ql: NONREACTIVE

## 2021-05-10 LAB — TYPE AND SCREEN
ABO/RH(D): A POS
Antibody Screen: NEGATIVE

## 2021-05-10 LAB — RESP PANEL BY RT-PCR (FLU A&B, COVID) ARPGX2
Influenza A by PCR: NEGATIVE
Influenza B by PCR: NEGATIVE
SARS Coronavirus 2 by RT PCR: NEGATIVE

## 2021-05-10 MED ORDER — DIPHENHYDRAMINE HCL 25 MG PO CAPS: 25.0000 mg | ORAL_CAPSULE | Freq: Four times a day (QID) | ORAL | Status: DC | PRN

## 2021-05-10 MED ORDER — ACETAMINOPHEN 325 MG PO TABS: 650.0000 mg | ORAL_TABLET | ORAL | Status: DC | PRN

## 2021-05-10 MED ORDER — OXYTOCIN BOLUS FROM INFUSION
333.0000 mL | Freq: Once | INTRAVENOUS | Status: AC
  Administered 2021-05-10: 333 mL via INTRAVENOUS

## 2021-05-10 MED ORDER — ZOLPIDEM TARTRATE 5 MG PO TABS: 5.0000 mg | ORAL_TABLET | Freq: Every evening | ORAL | Status: DC | PRN

## 2021-05-10 MED ORDER — PHENYLEPHRINE 40 MCG/ML (10ML) SYRINGE FOR IV PUSH (FOR BLOOD PRESSURE SUPPORT): 80.0000 ug | PREFILLED_SYRINGE | INTRAVENOUS | Status: DC | PRN

## 2021-05-10 MED ORDER — OXYCODONE-ACETAMINOPHEN 5-325 MG PO TABS: 2.0000 | ORAL_TABLET | ORAL | Status: DC | PRN

## 2021-05-10 MED ORDER — LACTATED RINGERS IV SOLN: INTRAVENOUS | Status: DC

## 2021-05-10 MED ORDER — LIDOCAINE-EPINEPHRINE (PF) 1.5 %-1:200000 IJ SOLN
INTRAMUSCULAR | Status: DC | PRN
  Administered 2021-05-10: 5 mL via EPIDURAL

## 2021-05-10 MED ORDER — MEDROXYPROGESTERONE ACETATE 150 MG/ML IM SUSP
150.0000 mg | INTRAMUSCULAR | Status: DC | PRN
  Filled 2021-05-10: qty 1

## 2021-05-10 MED ORDER — SIMETHICONE 80 MG PO CHEW
80.0000 mg | CHEWABLE_TABLET | ORAL | Status: DC | PRN
  Filled 2021-05-10: qty 1

## 2021-05-10 MED ORDER — TETANUS-DIPHTH-ACELL PERTUSSIS 5-2.5-18.5 LF-MCG/0.5 IM SUSY: 0.5000 mL | PREFILLED_SYRINGE | Freq: Once | INTRAMUSCULAR | Status: DC

## 2021-05-10 MED ORDER — OXYTOCIN-SODIUM CHLORIDE 30-0.9 UT/500ML-% IV SOLN
2.5000 [IU]/h | INTRAVENOUS | Status: DC
  Administered 2021-05-10: 2.5 [IU]/h via INTRAVENOUS
  Filled 2021-05-10: qty 500

## 2021-05-10 MED ORDER — PRENATAL MULTIVITAMIN CH
1.0000 | ORAL_TABLET | Freq: Every day | ORAL | Status: DC
  Administered 2021-05-11: 1 via ORAL
  Filled 2021-05-10: qty 1

## 2021-05-10 MED ORDER — LIDOCAINE HCL (PF) 1 % IJ SOLN
INTRAMUSCULAR | Status: DC | PRN
  Administered 2021-05-10: 5 mL via EPIDURAL

## 2021-05-10 MED ORDER — FENTANYL CITRATE (PF) 100 MCG/2ML IJ SOLN: 50.0000 ug | INTRAMUSCULAR | Status: DC | PRN

## 2021-05-10 MED ORDER — EPHEDRINE 5 MG/ML INJ: 10.0000 mg | INTRAVENOUS | Status: DC | PRN

## 2021-05-10 MED ORDER — ONDANSETRON HCL 4 MG/2ML IJ SOLN: 4.0000 mg | INTRAMUSCULAR | Status: DC | PRN

## 2021-05-10 MED ORDER — LACTATED RINGERS IV SOLN
500.0000 mL | Freq: Once | INTRAVENOUS | Status: AC
  Administered 2021-05-10: 500 mL via INTRAVENOUS

## 2021-05-10 MED ORDER — SENNOSIDES-DOCUSATE SODIUM 8.6-50 MG PO TABS
2.0000 | ORAL_TABLET | Freq: Every day | ORAL | Status: DC
  Administered 2021-05-11: 2 via ORAL
  Filled 2021-05-10: qty 2

## 2021-05-10 MED ORDER — IBUPROFEN 600 MG PO TABS
600.0000 mg | ORAL_TABLET | Freq: Four times a day (QID) | ORAL | Status: DC
  Administered 2021-05-10 – 2021-05-11 (×4): 600 mg via ORAL
  Filled 2021-05-10 (×5): qty 1

## 2021-05-10 MED ORDER — LACTATED RINGERS IV SOLN: 500.0000 mL | INTRAVENOUS | Status: DC | PRN

## 2021-05-10 MED ORDER — ONDANSETRON HCL 4 MG PO TABS: 4.0000 mg | ORAL_TABLET | ORAL | Status: DC | PRN

## 2021-05-10 MED ORDER — SOD CITRATE-CITRIC ACID 500-334 MG/5ML PO SOLN: 30.0000 mL | ORAL | Status: DC | PRN

## 2021-05-10 MED ORDER — WITCH HAZEL-GLYCERIN EX PADS: 1.0000 "application " | MEDICATED_PAD | CUTANEOUS | Status: DC | PRN

## 2021-05-10 MED ORDER — LIDOCAINE HCL (PF) 1 % IJ SOLN: 30.0000 mL | INTRAMUSCULAR | Status: DC | PRN

## 2021-05-10 MED ORDER — DIBUCAINE (PERIANAL) 1 % EX OINT: 1.0000 "application " | TOPICAL_OINTMENT | CUTANEOUS | Status: DC | PRN

## 2021-05-10 MED ORDER — BENZOCAINE-MENTHOL 20-0.5 % EX AERO
1.0000 "application " | INHALATION_SPRAY | CUTANEOUS | Status: DC | PRN
  Administered 2021-05-10: 1 via TOPICAL
  Filled 2021-05-10: qty 56

## 2021-05-10 MED ORDER — OXYCODONE-ACETAMINOPHEN 5-325 MG PO TABS: 1.0000 | ORAL_TABLET | ORAL | Status: DC | PRN

## 2021-05-10 MED ORDER — COCONUT OIL OIL: 1.0000 "application " | TOPICAL_OIL | Status: DC | PRN

## 2021-05-10 MED ORDER — DIPHENHYDRAMINE HCL 50 MG/ML IJ SOLN: 12.5000 mg | INTRAMUSCULAR | Status: DC | PRN

## 2021-05-10 MED ORDER — FENTANYL-BUPIVACAINE-NACL 0.5-0.125-0.9 MG/250ML-% EP SOLN
12.0000 mL/h | EPIDURAL | Status: DC | PRN
  Administered 2021-05-10: 12 mL/h via EPIDURAL
  Filled 2021-05-10: qty 250

## 2021-05-10 MED ORDER — ONDANSETRON HCL 4 MG/2ML IJ SOLN: 4.0000 mg | Freq: Four times a day (QID) | INTRAMUSCULAR | Status: DC | PRN

## 2021-05-10 NOTE — Progress Notes (Signed)
Labor Progress Note Annette Brock is a 21 y.o. G2P1001 at [redacted]w[redacted]d who presented for SOL/TOLAC.   S: Comfortable after epidural placement. No concerns at this time.   O:  BP 111/63    Pulse 88    Temp 98.2 F (36.8 C) (Oral)    Resp 16    Ht 5\' 3"  (1.6 m)    Wt 83.8 kg    LMP 07/27/2020 (Exact Date)    SpO2 100%    BMI 32.72 kg/m   EFM: Baseline 135 bpm, moderate variability, + accels, no decels  Toco: Every 2 minutes   CVE: Dilation: 6 Effacement (%): 90 Station: 0 Presentation: Vertex Exam by:: Dr [email or web address]   A&P: 21 y.o. G2P1001 [redacted]w[redacted]d   #Labor: Progressing well. AROM performed with clear fluid after verbal consent. Mom and baby tolerated this well. Continues to contract every 2 minutes. Will continue expectant management and reassess in 2-3 hours, sooner if patient starts feeling pressure/urge to push.  #Pain: Epidural  #FWB: Cat 1  #GBS negative  [redacted]w[redacted]d, MD 6:40 AM

## 2021-05-10 NOTE — Lactation Note (Signed)
This note was copied from a baby's chart. Lactation Consultation Note   Patient Name: Boy Sreshta Malkiewicz [id number] Date: 05/10/2021 Reason for consult: Initial assessment;Early term 37-38.6wks Age:20 hours   P2 mom who recently fed baby.  [facility] student was unable to witness latch during this visit.  Per mom infant is latching well without pain and discomfort. Mother states she had oversupply with her first child and she had to pump for relief. Basic breastfeeding education reviewed with mom.  Outpatient LC resources given to mom.     Feeding plan: 1- Feed 8-12+ times in 24 hours feeding on hunger cues.   2- Continue skin to skin 3- Hand express as needed    Mom is aware she can call LC/RN as needed for assistance.      Maternal Data Has patient been taught Hand Expression?: Yes Does the patient have breastfeeding experience prior to this delivery?: Yes   Feeding Mother's Current Feeding Choice: Breast Milk   LATCH Score Latch: Grasps breast easily, tongue down, lips flanged, rhythmical sucking.   Audible Swallowing: A few with stimulation   Type of Nipple: Everted at rest and after stimulation   Comfort (Breast/Nipple): Soft / non-tender   Hold (Positioning): Assistance needed to correctly position infant at breast and maintain latch.   LATCH Score: 8      Interventions Interventions: Breast feeding basics reviewed;Education;Skin to skin;Hand express   Discharge Pump: Personal   Consult Status Consult Status: Follow-up Date: 05/11/21 Follow-up type: In-patient     [place] student 05/10/2021, 7:04 PM     I concur with Lactation Note written by [facility] student.  Roxie Gueye A Higuera Ancidey 05/10/2021, 7:12 PM

## 2021-05-10 NOTE — H&P (Addendum)
OBSTETRIC ADMISSION HISTORY AND PHYSICAL  Annette Brock is a 21 y.o. female G2P1001 with IUP at 55w1dby LMP c/w 6-week [place]ore[place]resenting for SOL. Presented to MAU with report of contractions every 2 minutes since 0130. Had intercourse last night. Reporting some bloody show, denies LOF. She was seen in the office yesterday, SVE was 3.5 cm at that time. Dilated to 4 cm on initial exam in MAU, progressed to 6 cm on repeat. She has a history of pLTCS secondary to non-reassuring fetal heart tones at 9 cm dilation. She reports +FMs, no blurry vision, headaches, peripheral edema, or RUQ pain.  She plans on breast and bottle feeding. She requests the transdermal patch for birth control postpartum.   [facility]  Dating: By LMP/6-wk [place]--->  Estimated Date of Delivery: 05/23/21  Sono:   @[redacted]w[redacted]d , normal anatomy, variable presentation, anterior placental lie, 287 g, 66% EFW  Prenatal History/Complications:  H/o cesarean section (due to NRFHT)  Past Medical History: Past Medical History:  Diagnosis Date   Chlamydia infection affecting pregnancy 09/21/2020   COVID-19 02/2019   History of COVID-19 10/03/2020   02/2019    Past Surgical History: Past Surgical History:  Procedure Laterality Date   CESAREAN SECTION N/A 03/06/2019   Procedure: CESAREAN SECTION;  Surgeon: OJanyth Pupa DO;  Location: [facility];  Service: Obstetrics;  Laterality: N/A;    Obstetrical History: OB History     Gravida  2   Para  1   Term  1   Preterm      AB      Living  1      SAB      IAB      Ectopic      Multiple  0   Live Births  1           Social History Social History   Socioeconomic History   Marital status: Single    Spouse name: Not on file   Number of children: Not on file   Years of education: Not on file   Highest education level: Not on file  Occupational History   Not on file  Tobacco Use   Smoking status: Never    Passive exposure:  Yes   Smokeless tobacco: Never  Vaping Use   Vaping Use: Former   Quit date: 09/03/2020   Substances: Nicotine  Substance and Sexual Activity   Alcohol use: No   Drug use: No   Sexual activity: Yes    Birth control/protection: None  Other Topics Concern   Not on file  Social History Narrative   Not on file   Social Determinants of Health   Financial Resource Strain: Not on file  Food Insecurity: No Food Insecurity   Worried About Running Out of Food in the Last Year: Never true   ROsagein the Last Year: Never true  Transportation Needs: No Transportation Needs   Lack of Transportation (Medical): No   Lack of Transportation (Non-Medical): No  Physical Activity: Not on file  Stress: Not on file  Social Connections: Not on file    Family History: Family History  Problem Relation Age of Onset   Hypertension Mother    Diabetes Mother     Allergies: Allergies  Allergen Reactions   Mushroom Extract Complex     Medications Prior to Admission  Medication Sig Dispense Refill Last Dose   Prenatal Vit-Fe Fumarate-FA (PRENATAL MULTIVITAMIN) TABS tablet  Take 1 tablet by mouth daily at 12 noon.   05/09/2021   acetaminophen (TYLENOL) 500 MG tablet Take 500 mg by mouth every 6 (six) hours as needed.      Blood Pressure Monitoring (BLOOD PRESSURE KIT) DEVI 1 Device by Does not apply route as needed. (Patient not taking: Reported on 01/02/2021) 1 each 0      Review of Systems  All systems reviewed and negative except as stated in HPI  Blood pressure 121/73, pulse 87, temperature 97.8 F (36.6 C), resp. rate 16, height 5' 3"  (1.6 m), weight 83.8 kg, last menstrual period 07/27/2020, SpO2 100 %, unknown if currently breastfeeding.  General appearance: alert, cooperative, and no distress Lungs: normal work of breathing on room air Heart: regular rate, warm and well-perfused Abdomen: soft, non-tender; gravid Extremities: no LE edema, no calf tenderness to  palpation  Presentation: cephalic per RN Fetal monitoring: Baseline FHR 130 bpm/moderate variability/+accels, no decels Uterine activity: Regular, every 2-6 minutes Dilation: 6 Effacement (%): 80 Station: -2 Exam by:: [employer], RN   Prenatal labs: ABO, Rh: A/Positive/-- (08/17 6701) Antibody: Negative (08/17 0918) Rubella: 3.24 (08/17 0918) RPR: Non Reactive (12/14 0855)  HBsAg: Negative (08/17 0918)  HIV: Non Reactive (12/14 0855)  GBS: Negative/-- (02/09 1615)  2 hr Glucola: Normal Genetic screening: LR NIPS, AFP neg, Horizon LR Anatomy US: Normal  Prenatal Transfer Tool  Maternal Diabetes: No Genetic Screening: Normal Maternal Ultrasounds/Referrals: Normal Fetal Ultrasounds or other Referrals:  None Maternal Substance Abuse:  No Significant Maternal Medications:  None Significant Maternal Lab Results: Group B Strep negative  No results found for this or any previous visit (from the past 24 hour(s)).  Patient Active Problem List   Diagnosis Date Noted   Supervision of low-risk pregnancy 10/03/2020   History of C-section 10/03/2020    Assessment/Plan:  Annette Brock is a 21 y.o. G2P1001 at 82w1dhere for SOL. H/o pLTCS secondary to NRFHT at 9cm dilation. Desiring VBAC.  #Labor: Patient is favorable candidate for TOLAC, discussed risks/benefits and patient voiced understanding. Patient elects to proceed with TOLAC. Plan for AROM once pain is controlled with epidural. Anticipate VBAC.  #Pain: Planning for epidural #FWB: Cat 1 #ID: GBS negative #MOF: Breast/Bottle #MOC:Transdermal patch  #Circ: Yes, outpatient   SVennie Homans DO PGY-1 05/10/2021, 3:58 AM  GME ATTESTATION:  I saw and evaluated the patient. I agree with the findings and the plan of care as documented in the residents note. I have made changes to documentation as necessary.  SOL/TOLAC. Progressing well. Will continue expectant management with plan for AROM after epidural  placement. TOLAC risks/benefits reviewed and patient agrees to proceed with TOLAC. Consent previously signed 02/27/21. Cat 1 tracing. Will continue to monitor.   CVilma Meckel MD OB Fellow, [place]2/24/2023 9:10 AM

## 2021-05-10 NOTE — Lactation Note (Signed)
This note was copied from a baby's chart. Lactation Consultation Note  Patient Name: Annette Brock [id number] Date: 05/10/2021 Reason for consult: L&D Initial assessment;Mother's request;Early term 37-38.6wks;Breastfeeding assistance;RN request Age:20 hours  Infant latched prior to [facility] arrival. LC assisted changing latch from cradle to cross cradle prone. Infant showing signs of milk transfer.  Infant still feeding at the end of the visit.  Mom feeding plan to EBF.  All questions answered at the end of the visit.   Maternal Data Does the patient have breastfeeding experience prior to this delivery?: Yes How long did the patient breastfeed?: 9 months  Feeding Mother's Current Feeding Choice: Breast Milk  LATCH Score                    Lactation Tools Discussed/Used    Interventions Interventions: Breast feeding basics reviewed;Assisted with latch;Skin to skin;Breast massage;Breast compression;Adjust position;Education;Tour manager education  Discharge    Consult Status Consult Status: Follow-up from L&D Date: 05/11/21 Follow-up type: In-patient    Kaitlan Bin  Nicholson-Springer 05/10/2021, 12:04 PM

## 2021-05-10 NOTE — Anesthesia Procedure Notes (Signed)
Epidural Patient location during procedure: OB Start time: 05/10/2021 4:36 AM End time: 05/10/2021 4:46 AM  Staffing Anesthesiologist: Murvin Natal, MD Performed: anesthesiologist   Preanesthetic Checklist Completed: patient identified, IV checked, site marked, risks and benefits discussed, monitors and equipment checked, pre-op evaluation and timeout performed  Epidural Patient position: sitting Prep: DuraPrep Patient monitoring: heart rate, cardiac monitor, continuous pulse ox and blood pressure Approach: midline Location: L4-L5 Injection technique: LOR air  Needle:  Needle type: Tuohy  Needle gauge: 17 G Needle length: 9 cm Needle insertion depth: 6 cm Catheter type: closed end flexible Catheter size: 19 Gauge Catheter at skin depth: 11 cm Test dose: negative and 1.5% lidocaine with Epi 1:200 K  Assessment Events: blood not aspirated, injection not painful, no injection resistance and negative IV test  Additional Notes Informed consent obtained prior to proceeding including risk of failure, 1% risk of PDPH, risk of minor discomfort and bruising. Discussed alternatives to epidural analgesia and patient desires to proceed.  Timeout performed pre-procedure verifying patient name, procedure, and platelet count.  Patient tolerated procedure well. Reason for block:procedure for pain

## 2021-05-10 NOTE — Discharge Summary (Signed)
° °  Postpartum Discharge Summary ° °   °Patient Name: Annette Brock °DOB: 03/19/2000 °MRN: [id number] ° °Date of admission: 05/10/2021 °Delivery date:05/10/2021  °Delivering provider: BEARD, Farran N  °Date of discharge: 05/10/2021 ° °Admitting diagnosis: Normal labor [O80, Z37.9] °Intrauterine pregnancy: [redacted]w[redacted]d     °Secondary diagnosis:  Principal Problem: °  Normal labor °Active Problems: °  History of C-section °  VBAC, delivered ° °Additional problems: N/A    °Discharge diagnosis: Term Pregnancy Delivered                                              °Post partum procedures: N/A °Augmentation: AROM °Complications: None ° °Hospital course: Onset of Labor With Vaginal Delivery      °21 y.o. yo G2P1001 at [redacted]w[redacted]d was admitted in Active Labor on 05/10/2021. Patient had an uncomplicated labor course as follows:  °Membrane Rupture Time/Date: 6:23 AM ,05/10/2021   °Delivery Method:VBAC, Spontaneous  °Episiotomy: None  °Lacerations:  1st degree;Labial;Periurethral  °Patient had an uncomplicated postpartum course.  She is ambulating, tolerating a regular diet, passing flatus, and urinating well. Patient is discharged home in stable condition on 05/11/21. ° °Newborn Data: °Birth date:05/10/2021  °Birth time:11:06 AM  °Gender:Female  °Living status:Living  °Apgars: ,9  °Weight:3317 g  ° °Magnesium Sulfate received: No °BMZ received: No °Rhophylac:No °MMR:No °T-DaP:Given prenatally °Flu: No °Transfusion:No ° °Physical exam  °Vitals:  ° 05/10/21 1108 05/10/21 1119 05/10/21 1132 05/10/21 1147  °BP: 115/68 (!) 110/53 (!) 104/58 (!) 111/49  °Pulse: 99 74 76 81  °Resp:      °Temp:      °TempSrc:      °SpO2:      °Weight:      °Height:      ° °General: alert, cooperative, and no distress °Lochia: appropriate °Uterine Fundus: firm °Incision: N/A °DVT Evaluation: No evidence of DVT seen on physical exam. °Labs: °Lab Results  °Component Value Date  ° WBC 9.4 05/10/2021  ° HGB 10.0 (L) 05/10/2021  ° HCT 32.0 (L) 05/10/2021  ° MCV  82.5 05/10/2021  ° PLT 368 05/10/2021  ° °CMP Latest Ref Rng & Units 02/03/2016  °Glucose 65 - 99 mg/dL 93  °BUN 6 - 20 mg/dL 15  °Creatinine 0.50 - 1.00 mg/dL 0.60  °Sodium 135 - 145 mmol/L 141  °Potassium 3.5 - 5.1 mmol/L 3.4(L)  °Chloride 101 - 111 mmol/L 102  ° °Edinburgh Score: °Edinburgh Postnatal Depression Scale Screening Tool 03/06/2019  °I have been able to laugh and see the funny side of things. 0  °I have looked forward with enjoyment to things. 0  °I have blamed myself unnecessarily when things went wrong. 0  °I have been anxious or worried for no good reason. 0  °I have felt scared or panicky for no good reason. 0  °Things have been getting on top of me. 1  °I have been so unhappy that I have had difficulty sleeping. 0  °I have felt sad or miserable. 0  °I have been so unhappy that I have been crying. 0  °The thought of harming myself has occurred to me. 0  °Edinburgh Postnatal Depression Scale Total 1  ° ° ° °After visit meds:  °Allergies as of 05/11/2021   ° °   Reactions  ° Mushroom Extract Complex   ° °  ° °  °Medication List  °  ° °  TAKE these medications   ° °acetaminophen 325 MG tablet °Commonly known as: Tylenol °Take 2 tablets (650 mg total) by mouth every 4 (four) hours as needed for up to 180 doses (for pain scale < 4). °What changed:  °medication strength °how much to take °when to take this °reasons to take this °  °Blood Pressure Kit Devi °1 Device by Does not apply route as needed. °  °ibuprofen 600 MG tablet °Commonly known as: ADVIL °Take 1 tablet (600 mg total) by mouth every 6 (six) hours. °  °prenatal multivitamin Tabs tablet °Take 1 tablet by mouth daily at 12 noon. °  ° °  ° ° ° °Discharge home in stable condition °Infant Feeding: Breast °Infant Disposition:home with mother °Discharge instruction: per After Visit Summary and Postpartum booklet. °Activity: Advance as tolerated. Pelvic rest for 6 weeks.  °Diet: routine diet °Follow up Visit: ° °Message sent to [facility] by Dr Beard:   °Please schedule this patient for a In person postpartum visit in 6 weeks with the following provider: Any provider. °Additional Postpartum F/U: N/A   °High risk pregnancy complicated by:  h/o CS  °Delivery mode:  VBAC, Spontaneous  °Anticipated Birth Control:   Patch  ° ° °Enrica Weinhold, MSA, MSN, CNM °Certified Nurse Midwife, Faculty Practice °[facility] ° ° ° ° °

## 2021-05-10 NOTE — MAU Note (Signed)
..  Annette Brock is a 21 y.o. at [redacted]w[redacted]d here in MAU reporting: Ctx every 2 mins since 0130. Pt states she has lost her mucous plug and some bloody show and spotting for SVE yesterday in office. Pt denies DFM, VB, LOF, PIH s/s, and complications in the pregnancy. Last intercourse was last night.  SVE yesterday 3.5cm GBS NEG  Onset of complaint: 0130 Pain score: 9/10 Vitals:   05/10/21 0234  BP: 123/65  Pulse: 85  Resp: 16  Temp: 97.8 F (36.6 C)  SpO2: 97%     FHT:125 Lab orders placed from triage:  none

## 2021-05-10 NOTE — Anesthesia Preprocedure Evaluation (Signed)
Anesthesia Evaluation  Patient identified by MRN, date of birth, ID band Patient awake    Reviewed: Allergy & Precautions, H&P , NPO status , Patient's Chart, lab work & pertinent test results  History of Anesthesia Complications Negative for: history of anesthetic complications  Airway Mallampati: II  TM Distance: >3 FB Neck ROM: full    Dental no notable dental hx. (+) Teeth Intact   Pulmonary neg pulmonary ROS,    Pulmonary exam normal breath sounds clear to auscultation       Cardiovascular negative cardio ROS Normal cardiovascular exam Rhythm:regular Rate:Normal     Neuro/Psych negative neurological ROS  negative psych ROS   GI/Hepatic negative GI ROS, Neg liver ROS,   Endo/Other  negative endocrine ROS  Renal/GU negative Renal ROS  negative genitourinary   Musculoskeletal   Abdominal (+) + obese,   Peds  Hematology  (+) Blood dyscrasia, anemia ,   Anesthesia Other Findings   Reproductive/Obstetrics (+) Pregnancy                             Anesthesia Physical Anesthesia Plan  ASA: 2  Anesthesia Plan: Epidural   Post-op Pain Management:    Induction:   PONV Risk Score and Plan:   Airway Management Planned:   Additional Equipment:   Intra-op Plan:   Post-operative Plan:   Informed Consent: I have reviewed the patients History and Physical, chart, labs and discussed the procedure including the risks, benefits and alternatives for the proposed anesthesia with the patient or authorized representative who has indicated his/her understanding and acceptance.       Plan Discussed with:   Anesthesia Plan Comments:         Anesthesia Quick Evaluation  

## 2021-05-11 MED ORDER — IBUPROFEN 600 MG PO TABS: 600.0000 mg | ORAL_TABLET | Freq: Four times a day (QID) | ORAL | 0 refills | Status: DC

## 2021-05-11 MED ORDER — ACETAMINOPHEN 325 MG PO TABS: 650.0000 mg | ORAL_TABLET | ORAL | 0 refills | Status: DC | PRN

## 2021-05-11 NOTE — Lactation Note (Signed)
This note was copied from a baby's chart. Lactation Consultation Note  Patient Name: Annette Brock [id number] Date: 05/11/2021 Reason for consult: Follow-up assessment;Early term 37-38.6wks Age:20 hours  LC in to visit with P2 Mom of ET infant on day of discharge.  Baby at 2% weight loss with good output.    Baby latched in cradle hold while swaddled in blanket.  Mom denies any pain, but LC noted baby was sucking with non-nutritive suck pattern.  Recommended STS when feeding baby.  Mom started to unwrap baby.  LC talked about the importance of supporting baby at the height of her breast.  Adjusted hand placement to cross cradle hold and assisted Mom to wait for a wide open mouth before latching onto the breast.  Baby latched easily.  Showed FOB how to assess and tug on baby's chin to open mouth wider and make sure lips were flanged.  Encouraged Mom to compress breast during sucking to increase milk transfer.  Engorgement prevention and treatment reviewed.  Mom aware of OP lactation support. Encouraged Mom to call prn for assistance.  LATCH Score Latch: Grasps breast easily, tongue down, lips flanged, rhythmical sucking.  Audible Swallowing: A few with stimulation  Type of Nipple: Everted at rest and after stimulation  Comfort (Breast/Nipple): Soft / non-tender  Hold (Positioning): Assistance needed to correctly position infant at breast and maintain latch.  LATCH Score: 8  Interventions Interventions: Breast feeding basics reviewed;Assisted with latch;Skin to skin;Breast massage;Hand express;Breast compression;Adjust position;Support pillows;Position options;Hand pump  Discharge Discharge Education: Engorgement and breast care;Warning signs for feeding baby Pump: Manual  Consult Status Consult Status: Complete Date: 05/11/21 Follow-up type: Call as needed    [name] 05/11/2021, 3:36 PM

## 2021-05-11 NOTE — Anesthesia Postprocedure Evaluation (Signed)
Anesthesia Post Note  Patient: Annette Brock  Procedure(s) Performed: AN AD HOC LABOR EPIDURAL     Patient location during evaluation: Mother Baby Anesthesia Type: Epidural Level of consciousness: awake and alert Pain management: pain level controlled Vital Signs Assessment: post-procedure vital signs reviewed and stable Respiratory status: spontaneous breathing, nonlabored ventilation and respiratory function stable Cardiovascular status: stable Postop Assessment: no headache, no backache and epidural receding Anesthetic complications: no   No notable events documented.  Last Vitals:  Vitals:   05/11/21 0026 05/11/21 0646  BP: 113/73 (!) 95/54  Pulse: 90 75  Resp: 18 18  Temp: 36.9 C 36.8 C  SpO2: 97%     Last Pain:  Vitals:   05/11/21 0646  TempSrc: Oral  PainSc:    Pain Goal:                   Rica Records

## 2021-05-11 NOTE — Plan of Care (Signed)
Problem: Life Cycle: Goal: Chance of risk for complications during the postpartum period will decrease Outcome: Completed/Met   Problem: Role Relationship: Goal: Ability to demonstrate positive interaction with newborn will improve Outcome: Completed/Met   Problem: Skin Integrity: Goal: Demonstration of wound healing without infection will improve Outcome: Completed/Met   

## 2021-05-11 NOTE — Discharge Instructions (Signed)

## 2021-05-16 ENCOUNTER — Encounter: Payer: Medicaid Other | Admitting: Advanced Practice Midwife

## 2021-05-22 ENCOUNTER — Telehealth (HOSPITAL_COMMUNITY): Payer: Self-pay | Admitting: *Deleted

## 2021-05-22 NOTE — Telephone Encounter (Signed)
Mom reports feeling good. No concerns about herself at this time. EPDS=1 [facility] score=0) ?Mom reports baby is doing well. Feeding, peeing, and pooping without difficulty. Safe sleep reviewed. Mom reports no concerns about baby at present. ? ?Duffy Rhody, RN 05-22-2021 at 10:00am ?

## 2021-06-20 ENCOUNTER — Ambulatory Visit: Payer: Medicaid Other | Admitting: Obstetrics & Gynecology

## 2021-07-24 ENCOUNTER — Ambulatory Visit: Payer: Medicaid Other | Admitting: Obstetrics & Gynecology

## 2021-11-18 ENCOUNTER — Ambulatory Visit
Admission: EM | Admit: 2021-11-18 | Discharge: 2021-11-18 | Disposition: A | Discharge status: Home / Self Care | Payer: Self-pay | Attending: Urgent Care | Admitting: Urgent Care

## 2021-11-18 DIAGNOSIS — H109 Unspecified conjunctivitis: Secondary | ICD-10-CM

## 2021-11-18 DIAGNOSIS — B9689 Other specified bacterial agents as the cause of diseases classified elsewhere: Secondary | ICD-10-CM

## 2021-11-18 MED ORDER — TOBRAMYCIN 0.3 % OP SOLN: 1.0000 [drp] | OPHTHALMIC | 0 refills | Status: DC

## 2021-11-18 MED ORDER — OLOPATADINE HCL 0.1 % OP SOLN
1.0000 [drp] | Freq: Two times a day (BID) | OPHTHALMIC | 0 refills | Status: DC
Start: 2021-11-18 — End: 2022-01-06

## 2021-11-18 NOTE — ED Triage Notes (Signed)
Pt c/o conjunctivitis that started in left eye Saturday and now moving into the right. States it is itchy, reports green/yellow discharge. Denies pain.

## 2021-11-18 NOTE — ED Provider Notes (Signed)
[facility]  Note:  This document was prepared using Dragon voice recognition software and may include unintentional dictation errors.  MRN: [id number] DOB: 2000-09-27  Subjective:   Annette Brock is a 21 y.o. female presenting for 3 day history of acute onset persistent left eye irritation, redness and drainage (worse in the mornings) now affecting the right eye.  No contact lens use, eye products, eyelash extension use.  No vision changes, photophobia.  No current facility-administered medications for this encounter.  Current Outpatient Medications:    acetaminophen (TYLENOL) 325 MG tablet, Take 2 tablets (650 mg total) by mouth every 4 (four) hours as needed for up to 180 doses (for pain scale < 4)., Disp: 180 tablet, Rfl: 0   Blood Pressure Monitoring (BLOOD PRESSURE KIT) DEVI, 1 Device by Does not apply route as needed. (Patient not taking: Reported on 01/02/2021), Disp: 1 each, Rfl: 0   ibuprofen (ADVIL) 600 MG tablet, Take 1 tablet (600 mg total) by mouth every 6 (six) hours., Disp: 30 tablet, Rfl: 0   Prenatal Vit-Fe Fumarate-FA (PRENATAL MULTIVITAMIN) TABS tablet, Take 1 tablet by mouth daily at 12 noon., Disp: , Rfl:    Allergies  Allergen Reactions   Mushroom Extract Complex     Past Medical History:  Diagnosis Date   Chlamydia infection affecting pregnancy 09/21/2020   COVID-19 02/2019   History of COVID-19 10/03/2020   02/2019     Past Surgical History:  Procedure Laterality Date   CESAREAN SECTION N/A 03/06/2019   Procedure: CESAREAN SECTION;  Surgeon: Janyth Pupa, DO;  Location: [facility] LD ORS;  Service: Obstetrics;  Laterality: N/A;    Family History  Problem Relation Age of Onset   Hypertension Mother    Diabetes Mother     Social History   Tobacco Use   Smoking status: Never    Passive exposure: Yes   Smokeless tobacco: Never  Vaping Use   Vaping Use: Former   Quit date: 09/03/2020   Substances: Nicotine  Substance Use Topics    Alcohol use: No   Drug use: No    ROS   Objective:   Vitals: BP 105/73 (BP Location: Left Arm)   Pulse 85   Temp 98 F (36.7 C) (Oral)   Resp 16   SpO2 98%   Breastfeeding No   Physical Exam Constitutional:      General: She is not in acute distress.    Appearance: Normal appearance. She is well-developed. She is not ill-appearing, toxic-appearing or diaphoretic.  HENT:     Head: Normocephalic and atraumatic.     Nose: Nose normal.     Mouth/Throat:     Mouth: Mucous membranes are moist.  Eyes:     General: Lids are normal. Lids are everted, no foreign bodies appreciated. Vision grossly intact. No scleral icterus.       Right eye: No foreign body, discharge or hordeolum.        Left eye: No foreign body, discharge or hordeolum.     Extraocular Movements: Extraocular movements intact.     Right eye: Normal extraocular motion.     Left eye: Normal extraocular motion and no nystagmus.     Conjunctiva/sclera:     Right eye: Right conjunctiva is injected. No chemosis, exudate or hemorrhage.    Left eye: Left conjunctiva is injected. No chemosis, exudate or hemorrhage. Cardiovascular:     Rate and Rhythm: Normal rate.  Pulmonary:     Effort: Pulmonary effort is normal.  Skin:    General: Skin is warm and dry.  Neurological:     General: No focal deficit present.     Mental Status: She is alert and oriented to person, place, and time.  Psychiatric:        Mood and Affect: Mood normal.        Behavior: Behavior normal.     Assessment and Plan :   PDMP not reviewed this encounter.  1. Bacterial conjunctivitis of both eyes     Will start tobramycin to address bacterial conjunctivitis of both eyes. Olopatadine eye drops for the itching. Counseled patient on potential for adverse effects with medications prescribed/recommended today, ER and return-to-clinic precautions discussed, patient verbalized understanding.    Jaynee Eagles, [place] 11/18/21 1907

## 2021-12-02 ENCOUNTER — Ambulatory Visit: Payer: Medicaid Other | Admitting: Medical

## 2021-12-02 ENCOUNTER — Other Ambulatory Visit: Payer: Self-pay

## 2022-01-06 ENCOUNTER — Encounter: Payer: Self-pay | Admitting: Obstetrics and Gynecology

## 2022-01-06 ENCOUNTER — Ambulatory Visit (INDEPENDENT_AMBULATORY_CARE_PROVIDER_SITE_OTHER): Payer: Medicaid Other | Admitting: Obstetrics and Gynecology

## 2022-01-06 ENCOUNTER — Other Ambulatory Visit: Payer: Self-pay

## 2022-01-06 DIAGNOSIS — Z3202 Encounter for pregnancy test, result negative: Secondary | ICD-10-CM

## 2022-01-06 DIAGNOSIS — Z30016 Encounter for initial prescription of transdermal patch hormonal contraceptive device: Secondary | ICD-10-CM

## 2022-01-06 DIAGNOSIS — Z309 Encounter for contraceptive management, unspecified: Secondary | ICD-10-CM | POA: Insufficient documentation

## 2022-01-06 LAB — POCT PREGNANCY, URINE: Preg Test, Ur: NEGATIVE

## 2022-01-06 MED ORDER — NORELGESTROMIN-ETH ESTRADIOL 150-35 MCG/24HR TD PTWK: 1.0000 | MEDICATED_PATCH | TRANSDERMAL | 12 refills | Status: DC

## 2022-01-06 NOTE — Patient Instructions (Signed)

## 2022-01-06 NOTE — Progress Notes (Signed)
Ms Annette Brock presents to discuss contraception Desires contraception patch Has used in the passed without problems LMP 12/20/21 Monthly cycles Pap NA  PE AF VSS Lungs clear Heart RRR Abd soft + BS Ext non tender  A/P Contraception Management  U/R/B and back up method reviewed. To start Sunday after next cycle F/U in 1 yr or PRN

## 2022-03-27 ENCOUNTER — Ambulatory Visit: Payer: Medicaid Other | Admitting: Nurse Practitioner

## 2022-06-18 ENCOUNTER — Ambulatory Visit
Admission: EM | Admit: 2022-06-18 | Discharge: 2022-06-18 | Disposition: A | Discharge status: Home / Self Care | Payer: Medicaid Other | Attending: Urgent Care | Admitting: Urgent Care

## 2022-06-18 ENCOUNTER — Ambulatory Visit: Payer: Self-pay

## 2022-06-18 DIAGNOSIS — N10 Acute tubulo-interstitial nephritis: Secondary | ICD-10-CM | POA: Diagnosis not present

## 2022-06-18 LAB — POCT URINALYSIS DIP (MANUAL ENTRY)
Bilirubin, UA: NEGATIVE
Glucose, UA: NEGATIVE mg/dL
Ketones, POC UA: NEGATIVE mg/dL
Nitrite, UA: POSITIVE — AB
Protein Ur, POC: 100 mg/dL — AB
Spec Grav, UA: >=1.03 — AB (ref 1.010–1.025)
Urobilinogen, UA: 0.2 E.U./dL
pH, UA: 5.5 (ref 5.0–8.0)

## 2022-06-18 LAB — POCT URINE PREGNANCY: Preg Test, Ur: NEGATIVE

## 2022-06-18 MED ORDER — CEFTRIAXONE SODIUM 1 G IJ SOLR
1.0000 g | Freq: Once | INTRAMUSCULAR | Status: AC
  Administered 2022-06-18: 1 g via INTRAMUSCULAR

## 2022-06-18 MED ORDER — CIPROFLOXACIN HCL 500 MG PO TABS: 500.0000 mg | ORAL_TABLET | Freq: Two times a day (BID) | ORAL | 0 refills | Status: DC

## 2022-06-18 NOTE — ED Provider Notes (Signed)
[facility]  Note:  This document was prepared using Systems analyst and may include unintentional dictation errors.  MRN: [id number] DOB: 2000/05/13  Subjective:   Annette Brock is a 22 y.o. female presenting for 3-day history of acute onset persistent and worsening left-sided flank pain, urinary frequency and urgency.  No dysuria, hematuria, nausea, vomiting, fever.  Has a history of UTIs but this feels different to the patient.  She thought it might be a UTI but has never had that kind of pain she is having. No fever, vaginal discharge, n/v, concern for pregnancy.   No current facility-administered medications for this encounter.  Current Outpatient Medications:    acetaminophen (TYLENOL) 325 MG tablet, Take 2 tablets (650 mg total) by mouth every 4 (four) hours as needed for up to 180 doses (for pain scale < 4)., Disp: 180 tablet, Rfl: 0   ibuprofen (ADVIL) 600 MG tablet, Take 1 tablet (600 mg total) by mouth every 6 (six) hours., Disp: 30 tablet, Rfl: 0   norelgestromin-ethinyl estradiol Marilu Favre) 150-35 MCG/24HR transdermal patch, Place 1 patch onto the skin once a week., Disp: 3 patch, Rfl: 12   Allergies  Allergen Reactions   Mushroom Extract Complex     Past Medical History:  Diagnosis Date   Chlamydia infection affecting pregnancy 09/21/2020   COVID-19 02/2019   History of COVID-19 10/03/2020   02/2019     Past Surgical History:  Procedure Laterality Date   CESAREAN SECTION N/A 03/06/2019   Procedure: CESAREAN SECTION;  Surgeon: Janyth Pupa, DO;  Location: [facility];  Service: Obstetrics;  Laterality: N/A;    Family History  Problem Relation Age of Onset   Hypertension Mother    Diabetes Mother     Social History   Tobacco Use   Smoking status: Never    Passive exposure: Yes   Smokeless tobacco: Never  Vaping Use   Vaping Use: Former   Quit date: 09/03/2020   Substances: Nicotine  Substance Use Topics    Alcohol use: No   Drug use: No    ROS   Objective:   Vitals: BP 122/79 (BP Location: Right Arm)   Pulse 79   Temp 97.9 F (36.6 C) (Oral)   Resp 17   LMP 05/28/2022 (Within Days)   SpO2 98%   Breastfeeding No   Physical Exam Constitutional:      General: She is not in acute distress.    Appearance: Normal appearance. She is well-developed. She is not ill-appearing, toxic-appearing or diaphoretic.  HENT:     Head: Normocephalic and atraumatic.     Right Ear: External ear normal.     Left Ear: External ear normal.     Nose: Nose normal.     Mouth/Throat:     Mouth: Mucous membranes are moist.     Pharynx: Oropharynx is clear.  Eyes:     General: No scleral icterus.       Right eye: No discharge.        Left eye: No discharge.     Extraocular Movements: Extraocular movements intact.     Conjunctiva/sclera: Conjunctivae normal.  Cardiovascular:     Rate and Rhythm: Normal rate.  Pulmonary:     Effort: Pulmonary effort is normal.  Abdominal:     General: Bowel sounds are normal. There is no distension.     Palpations: Abdomen is soft. There is no mass.     Tenderness: There is no abdominal  tenderness. There is left CVA tenderness. There is no right CVA tenderness, guarding or rebound.  Skin:    General: Skin is warm and dry.  Neurological:     General: No focal deficit present.     Mental Status: She is alert and oriented to person, place, and time.  Psychiatric:        Mood and Affect: Mood normal.        Behavior: Behavior normal.        Thought Content: Thought content normal.        Judgment: Judgment normal.     Results for orders placed or performed during the hospital encounter of 06/18/22 (from the past 24 hour(s))  POCT urine pregnancy     Status: None   Collection Time: 06/18/22 12:56 PM  Result Value Ref Range   Preg Test, Ur Negative Negative  POCT urinalysis dipstick     Status: Abnormal   Collection Time: 06/18/22 12:56 PM  Result Value Ref  Range   Color, UA yellow yellow   Clarity, UA cloudy (A) clear   Glucose, UA negative negative mg/dL   Bilirubin, UA negative negative   Ketones, POC UA negative negative mg/dL   Spec Grav, UA >=1.030 (A) 1.010 - 1.025   Blood, UA small (A) negative   pH, UA 5.5 5.0 - 8.0   Protein Ur, POC =100 (A) negative mg/dL   Urobilinogen, UA 0.2 0.2 or 1.0 E.U./dL   Nitrite, UA Positive (A) Negative   Leukocytes, UA Small (1+) (A) Negative   IM ceftriaxone 1 g administered in clinic.  Assessment and Plan :   PDMP not reviewed this encounter.  1. Acute pyelonephritis     Will treat for pyelonephritis with IM ceftriaxone as above, ciprofloxacin as an outpatient. Recommended aggressive hydration, limiting urinary irritants. Counseled patient on potential for adverse effects with medications prescribed/recommended today, ER and return-to-clinic precautions discussed, patient verbalized understanding.    Jaynee Eagles, [place] 06/18/22 [id number]

## 2022-06-18 NOTE — ED Triage Notes (Signed)
Pt c/o left side flank pain, onset Monday has progressively gotten worse   Pt has last taken tylenol, at 8:00 am

## 2022-06-18 NOTE — Discharge Instructions (Signed)
Please start ciprofloxacin to address an urinary tract infection. Make sure you hydrate very well with plain water and a quantity of 80 ounces of water a day.  Please limit drinks that are considered urinary irritants such as soda, sweet tea, coffee, energy drinks, alcohol.  These can worsen your urinary and genital symptoms but also be the source of them.  I will let you know about your urine culture results through MyChart to see if we need to prescribe or change your antibiotics based off of those results. 

## 2022-06-20 LAB — URINE CULTURE: Culture: >=100000 — AB

## 2022-11-05 ENCOUNTER — Encounter: Payer: Self-pay | Admitting: Obstetrics and Gynecology

## 2022-11-05 DIAGNOSIS — Z30016 Encounter for initial prescription of transdermal patch hormonal contraceptive device: Secondary | ICD-10-CM

## 2022-11-06 MED ORDER — XULANE 150-35 MCG/24HR TD PTWK: 1.0000 | MEDICATED_PATCH | TRANSDERMAL | 1 refills | Status: DC

## 2022-11-14 ENCOUNTER — Ambulatory Visit (INDEPENDENT_AMBULATORY_CARE_PROVIDER_SITE_OTHER): Payer: Medicaid Other | Admitting: Obstetrics and Gynecology

## 2022-11-14 ENCOUNTER — Other Ambulatory Visit: Payer: Self-pay

## 2022-11-14 ENCOUNTER — Other Ambulatory Visit (HOSPITAL_COMMUNITY)
Admission: RE | Admit: 2022-11-14 | Discharge: 2022-11-14 | Disposition: A | Discharge status: Home / Self Care | Payer: Medicaid Other | Source: Ambulatory Visit | Attending: Obstetrics and Gynecology | Admitting: Obstetrics and Gynecology

## 2022-11-14 ENCOUNTER — Encounter: Payer: Self-pay | Admitting: Obstetrics and Gynecology

## 2022-11-14 VITALS — BP 109/70 | HR 68 | Wt 156.1 lb

## 2022-11-14 DIAGNOSIS — Z30016 Encounter for initial prescription of transdermal patch hormonal contraceptive device: Secondary | ICD-10-CM

## 2022-11-14 DIAGNOSIS — Z01419 Encounter for gynecological examination (general) (routine) without abnormal findings: Secondary | ICD-10-CM | POA: Diagnosis present

## 2022-11-14 DIAGNOSIS — Z202 Contact with and (suspected) exposure to infections with a predominantly sexual mode of transmission: Secondary | ICD-10-CM

## 2022-11-14 MED ORDER — XULANE 150-35 MCG/24HR TD PTWK: 1.0000 | MEDICATED_PATCH | TRANSDERMAL | 11 refills | Status: AC

## 2022-11-14 NOTE — Progress Notes (Signed)
Annette Brock is a 22 y.o. [phone] female here for a routine annual gynecologic exam.  Current complaints: None.   Denies abnormal vaginal bleeding, discharge, pelvic pain, problems with intercourse or other gynecologic concerns.    Gynecologic History Patient's last menstrual period was 10/28/2022. Contraception: Ortho-Evra patches weekly Last Pap: NA.  Last mammogram: NA.  Obstetric History OB History  Gravida Para Term Preterm AB Living  2 2 2     2   SAB IAB Ectopic Multiple Live Births        0 2    # Outcome Date GA Lbr Len/2nd Weight Sex Type Anes PTL Lv  2 Term 05/10/21 [redacted]w[redacted]d 07:30 / 00:36 3.317 kg M VBAC EPI  LIV  1 Term 03/06/19 [redacted]w[redacted]d  2.86 kg M CS-LTranv EPI  LIV     Birth Comments: c/s for nrfhr    Past Medical History:  Diagnosis Date   Chlamydia infection affecting pregnancy 09/21/2020   COVID-19 02/2019   History of COVID-19 10/03/2020   02/2019    Past Surgical History:  Procedure Laterality Date   CESAREAN SECTION N/A 03/06/2019   Procedure: CESAREAN SECTION;  Surgeon: Myna Hidalgo, DO;  Location: [facility];  Service: Obstetrics;  Laterality: N/A;    No current outpatient medications on file prior to visit.   No current facility-administered medications on file prior to visit.    Allergies  Allergen Reactions   Mushroom Extract Complex     Social History   Socioeconomic History   Marital status: Single    Spouse name: Not on file   Number of children: Not on file   Years of education: Not on file   Highest education level: Not on file  Occupational History   Not on file  Tobacco Use   Smoking status: Never    Passive exposure: Yes   Smokeless tobacco: Never  Vaping Use   Vaping status: Former   Quit date: 09/03/2020   Substances: Nicotine  Substance and Sexual Activity   Alcohol use: No   Drug use: No   Sexual activity: Yes    Birth control/protection: Patch  Other Topics Concern   Not on file  Social History Narrative    Not on file   Social Determinants of Health   Financial Resource Strain: Not on file  Food Insecurity: No Food Insecurity (03/13/2021)   Hunger Vital Sign    Worried About Running Out of Food in the Last Year: Never true    Ran Out of Food in the Last Year: Never true  Transportation Needs: No Transportation Needs (03/13/2021)   PRAPARE - Administrator, Civil Service (Medical): No    Lack of Transportation (Non-Medical): No  Physical Activity: Not on file  Stress: Not on file  Social Connections: Not on file  Intimate Partner Violence: Low Risk  (06/17/2020)   Received from [facility], [facility]   Intimate Partner Violence    Insults You: Not on file    Threatens You: Not on file    Screams at You: Not on file    Physically Hurt: Not on file    Intimate Partner Violence Score: Not on file    Family History  Problem Relation Age of Onset   Hypertension Mother    Diabetes Mother     The following portions of the patient's history were reviewed and updated as appropriate: allergies, current medications, past family history, past medical history, past social history, past surgical history and  problem list.  Review of Systems Pertinent items noted in HPI and remainder of comprehensive ROS otherwise negative.   Objective:  BP 109/70   Pulse 68   Wt 70.8 kg   LMP 10/28/2022   BMI 27.65 kg/m  Chaperone present CONSTITUTIONAL: Well-developed, well-nourished female in no acute distress.  HENT:  Normocephalic, atraumatic, External right and left ear normal. Oropharynx is clear and moist EYES: Conjunctivae and EOM are normal. Pupils are equal, round, and reactive to light. No scleral icterus.  NECK: Normal range of motion, supple, no masses.  Normal thyroid.  SKIN: Skin is warm and dry. No rash noted. Not diaphoretic. No erythema. No pallor. NEUROLGIC: Alert and oriented to person, place, and time. Normal reflexes, muscle tone coordination. No cranial nerve  deficit noted. PSYCHIATRIC: Normal mood and affect. Normal behavior. Normal judgment and thought content. CARDIOVASCULAR: Normal heart rate noted, regular rhythm RESPIRATORY: Clear to auscultation bilaterally. Effort and breath sounds normal, no problems with respiration noted. BREASTS: Deferred ABDOMEN: Soft, normal bowel sounds, no distention noted.  No tenderness, rebound or guarding.  PELVIC: Normal appearing external genitalia; normal appearing vaginal mucosa and cervix.  No abnormal discharge noted.  Pap smear obtained.  Normal uterine size, no other palpable masses, no uterine or adnexal tenderness. MUSCULOSKELETAL: Normal range of motion. No tenderness.  No cyanosis, clubbing, or edema.  2+ distal pulses.   Assessment:  Annual gynecologic examination with pap smear STD testing Contraception management Plan:  Will follow up results of pap smear and manage accordingly. STD testing as per pt request Refill Ortho Evra Routine preventative health maintenance measures emphasized. Please refer to After Visit Summary for other counseling recommendations.    Hermina Staggers, MD, FACOG Attending Obstetrician & Gynecologist Center for [facility], [facility]

## 2022-11-15 LAB — HEPATITIS C ANTIBODY: Hep C Virus Ab: NONREACTIVE

## 2022-11-15 LAB — HEPATITIS B SURFACE ANTIGEN: Hepatitis B Surface Ag: NEGATIVE

## 2022-11-20 LAB — CYTOLOGY - PAP
Chlamydia: NEGATIVE
Comment: NEGATIVE
Comment: NEGATIVE
Comment: NEGATIVE
Comment: NORMAL
Diagnosis: NEGATIVE
High risk HPV: NEGATIVE
Neisseria Gonorrhea: NEGATIVE
Trichomonas: NEGATIVE

## 2023-01-28 ENCOUNTER — Ambulatory Visit
Admission: EM | Admit: 2023-01-28 | Discharge: 2023-01-28 | Disposition: A | Discharge status: Home / Self Care | Payer: Medicaid Other | Attending: Internal Medicine | Admitting: Internal Medicine

## 2023-01-28 DIAGNOSIS — N3001 Acute cystitis with hematuria: Secondary | ICD-10-CM

## 2023-01-28 LAB — POCT URINALYSIS DIP (MANUAL ENTRY)
Bilirubin, UA: NEGATIVE
Glucose, UA: NEGATIVE mg/dL
Ketones, POC UA: NEGATIVE mg/dL
Nitrite, UA: NEGATIVE
Protein Ur, POC: 100 mg/dL — AB
Spec Grav, UA: 1.025 (ref 1.010–1.025)
Urobilinogen, UA: 1 U/dL
pH, UA: 7 (ref 5.0–8.0)

## 2023-01-28 LAB — POCT URINE PREGNANCY: Preg Test, Ur: NEGATIVE

## 2023-01-28 MED ORDER — CEPHALEXIN 500 MG PO CAPS: 500.0000 mg | ORAL_CAPSULE | Freq: Two times a day (BID) | ORAL | 0 refills | Status: DC

## 2023-01-28 NOTE — Discharge Instructions (Addendum)
Please start Keflex to address an urinary tract infection. Make sure you hydrate very well with plain water and a quantity of 80 ounces of water a day.  Please limit drinks that are considered urinary irritants such as soda, sweet tea, coffee, energy drinks, alcohol.  These can worsen your urinary and genital symptoms but also be the source of them.  I will let you know about your urine culture results through MyChart to see if we need to prescribe or change your antibiotics based off of those results.  

## 2023-01-28 NOTE — ED Triage Notes (Signed)
Pt reports increase urinary frequency, low abdominal pain, headache and nausea,  started today.

## 2023-01-28 NOTE — ED Provider Notes (Signed)
[facility]  Note:  This document was prepared using Conservation officer, historic buildings and may include unintentional dictation errors.  MRN: [id number] DOB: [birth year]  Subjective:   Annette Brock is a 22 y.o. female presenting for 1 day history of urinary frequency, urinary urgency, lower abdominal pain and cramping, nausea without vomiting, headaches.  Patient has not been drinking water very well.  She has been drinking a lot of Red Bulls.  Has a history of UTIs, pyelonephritis.  No current facility-administered medications for this encounter.  Current Outpatient Medications:    XULANE 150-35 MCG/24HR transdermal patch, Place 1 patch onto the skin once a week., Disp: 3 patch, Rfl: 11   Allergies  Allergen Reactions   Mushroom Extract Complex     Past Medical History:  Diagnosis Date   Chlamydia infection affecting pregnancy 09/21/2020   COVID-19 02/2019   History of COVID-19 10/03/2020   02/2019     Past Surgical History:  Procedure Laterality Date   CESAREAN SECTION N/A 03/06/2019   Procedure: CESAREAN SECTION;  Surgeon: Myna Hidalgo, DO;  Location: [facility];  Service: Obstetrics;  Laterality: N/A;    Family History  Problem Relation Age of Onset   Hypertension Mother    Diabetes Mother     Social History   Tobacco Use   Smoking status: Never    Passive exposure: Yes   Smokeless tobacco: Never  Vaping Use   Vaping status: Former   Quit date: 09/03/2020   Substances: Nicotine  Substance Use Topics   Alcohol use: No   Drug use: No    ROS   Objective:   Vitals: BP 107/66 (BP Location: Left Arm)   Pulse 74   Temp 98.5 F (36.9 C) (Oral)   Resp 16   LMP  (Within Weeks) Comment: 1 week  SpO2 98%   Breastfeeding No   Physical Exam Constitutional:      General: She is not in acute distress.    Appearance: Normal appearance. She is well-developed. She is not ill-appearing, toxic-appearing or diaphoretic.  HENT:      Head: Normocephalic and atraumatic.     Nose: Nose normal.     Mouth/Throat:     Mouth: Mucous membranes are moist.     Pharynx: Oropharynx is clear.  Eyes:     General: No scleral icterus.       Right eye: No discharge.        Left eye: No discharge.     Extraocular Movements: Extraocular movements intact.     Conjunctiva/sclera: Conjunctivae normal.  Cardiovascular:     Rate and Rhythm: Normal rate.  Pulmonary:     Effort: Pulmonary effort is normal.  Abdominal:     General: Bowel sounds are normal. There is no distension.     Palpations: Abdomen is soft. There is no mass.     Tenderness: There is no abdominal tenderness. There is no right CVA tenderness, left CVA tenderness, guarding or rebound.  Skin:    General: Skin is warm and dry.  Neurological:     General: No focal deficit present.     Mental Status: She is alert and oriented to person, place, and time.  Psychiatric:        Mood and Affect: Mood normal.        Behavior: Behavior normal.        Thought Content: Thought content normal.        Judgment: Judgment normal.  Results for orders placed or performed during the hospital encounter of 01/28/23 (from the past 24 hour(s))  POCT urinalysis dipstick     Status: Abnormal   Collection Time: 01/28/23  5:39 PM  Result Value Ref Range   Color, UA yellow yellow   Clarity, UA turbid (A) clear   Glucose, UA negative negative mg/dL   Bilirubin, UA negative negative   Ketones, POC UA negative negative mg/dL   Spec Grav, UA 6.295 2.841 - 1.025   Blood, UA moderate (A) negative   pH, UA 7.0 5.0 - 8.0   Protein Ur, POC =100 (A) negative mg/dL   Urobilinogen, UA 1.0 0.2 or 1.0 E.U./dL   Nitrite, UA Negative Negative   Leukocytes, UA Small (1+) (A) Negative  POCT urine pregnancy     Status: None   Collection Time: 01/28/23  5:40 PM  Result Value Ref Range   Preg Test, Ur Negative Negative    Assessment and Plan :   PDMP not reviewed this encounter.  1.  Acute cystitis with hematuria    Start Keflex to cover for acute cystitis, urine culture pending.  Recommended aggressive hydration, limiting urinary irritants. Counseled patient on potential for adverse effects with medications prescribed/recommended today, ER and return-to-clinic precautions discussed, patient verbalized understanding.    Wallis Bamberg, [place] 01/28/23 3244

## 2023-01-29 ENCOUNTER — Encounter (HOSPITAL_COMMUNITY): Payer: Self-pay

## 2023-01-29 ENCOUNTER — Emergency Department (HOSPITAL_COMMUNITY)
Admission: EM | Admit: 2023-01-29 | Discharge: 2023-01-29 | Disposition: A | Discharge status: Home / Self Care | Payer: Medicaid Other | Attending: Emergency Medicine | Admitting: Emergency Medicine

## 2023-01-29 ENCOUNTER — Other Ambulatory Visit: Payer: Self-pay

## 2023-01-29 DIAGNOSIS — N3001 Acute cystitis with hematuria: Secondary | ICD-10-CM | POA: Insufficient documentation

## 2023-01-29 DIAGNOSIS — N760 Acute vaginitis: Secondary | ICD-10-CM | POA: Insufficient documentation

## 2023-01-29 DIAGNOSIS — B9689 Other specified bacterial agents as the cause of diseases classified elsewhere: Secondary | ICD-10-CM | POA: Insufficient documentation

## 2023-01-29 DIAGNOSIS — R319 Hematuria, unspecified: Secondary | ICD-10-CM | POA: Diagnosis present

## 2023-01-29 LAB — URINALYSIS, ROUTINE W REFLEX MICROSCOPIC
Bilirubin Urine: NEGATIVE
Glucose, UA: NEGATIVE mg/dL
Ketones, ur: NEGATIVE mg/dL
Nitrite: NEGATIVE
Protein, ur: 100 mg/dL — AB
RBC / HPF: >50 RBC/hpf (ref 0–5)
Specific Gravity, Urine: 1.017 (ref 1.005–1.030)
WBC, UA: >50 WBC/hpf (ref 0–5)
pH: 7 (ref 5.0–8.0)

## 2023-01-29 LAB — WET PREP, GENITAL
Sperm: NONE SEEN
Trich, Wet Prep: NONE SEEN
WBC, Wet Prep HPF POC: <10 (ref <10)
Yeast Wet Prep HPF POC: NONE SEEN

## 2023-01-29 LAB — PREGNANCY, URINE: Preg Test, Ur: NEGATIVE

## 2023-01-29 MED ORDER — METRONIDAZOLE 500 MG PO TABS: 500.0000 mg | ORAL_TABLET | Freq: Two times a day (BID) | ORAL | 0 refills | Status: DC

## 2023-01-29 NOTE — ED Provider Notes (Signed)
[place] EMERGENCY DEPARTMENT AT [facility] Provider Note   CSN: [id number] Arrival date & time: 01/29/23  1232     History  Chief Complaint  Patient presents with   Hematuria    f   Urinary Frequency   Abdominal Cramping    Annette Brock is a 22 y.o. female no significant past medical history presents today for UTI symptoms.  Patient states she went to urgent care yesterday and was prescribed an antibiotic but has not picked that up yet.  Patient states she began noticing blood clots in her urine this morning and cramping in her pelvic region.  She reports frequency.  She does report potential STI exposure without symptoms.  She states her last menstrual period was last week.  She denies dysuria or flank pain.   Hematuria  Urinary Frequency  Abdominal Cramping       Home Medications Prior to Admission medications   Medication Sig Start Date End Date Taking? Authorizing Provider  metroNIDAZOLE (FLAGYL) 500 MG tablet Take 1 tablet (500 mg total) by mouth 2 (two) times daily. 01/29/23  Yes Dolphus Jenny, PA-C  cephALEXin (KEFLEX) 500 MG capsule Take 1 capsule (500 mg total) by mouth 2 (two) times daily. 01/28/23   Wallis Bamberg, PA-C  Burr Medico 150-35 MCG/24HR transdermal patch Place 1 patch onto the skin once a week. 11/14/22   Hermina Staggers, MD      Allergies    Mushroom extract complex    Review of Systems   Review of Systems  Genitourinary:  Positive for frequency, hematuria and pelvic pain.    Physical Exam Updated Vital Signs BP 133/65 (BP Location: Right Arm)   Pulse 92   Temp 98.4 F (36.9 C) (Oral)   Resp 16   Ht 5\' 3"  (1.6 m)   Wt 61.2 kg   LMP  (Within Weeks)   SpO2 100%   BMI 23.91 kg/m  Physical Exam Vitals and nursing note reviewed.  Constitutional:      General: She is not in acute distress.    Appearance: She is well-developed.  HENT:     Head: Normocephalic and atraumatic.     Right Ear: External ear normal.      Left Ear: External ear normal.     Nose: Nose normal.  Eyes:     Conjunctiva/sclera: Conjunctivae normal.  Cardiovascular:     Rate and Rhythm: Normal rate and regular rhythm.     Heart sounds: No murmur heard. Pulmonary:     Effort: Pulmonary effort is normal. No respiratory distress.     Breath sounds: Normal breath sounds.  Abdominal:     Palpations: Abdomen is soft.     Tenderness: There is abdominal tenderness in the suprapubic area.  Musculoskeletal:        General: No swelling.     Cervical back: Neck supple.  Skin:    General: Skin is warm and dry.     Capillary Refill: Capillary refill takes less than 2 seconds.  Neurological:     General: No focal deficit present.     Mental Status: She is alert.  Psychiatric:        Mood and Affect: Mood normal.     ED Results / Procedures / Treatments   Labs (all labs ordered are listed, but only abnormal results are displayed) Labs Reviewed  WET PREP, GENITAL - Abnormal; Notable for the following components:      Result Value   Clue Cells  Wet Prep HPF POC PRESENT (*)    All other components within normal limits  URINALYSIS, ROUTINE W REFLEX MICROSCOPIC - Abnormal; Notable for the following components:   APPearance CLOUDY (*)    Hgb urine dipstick MODERATE (*)    Protein, ur 100 (*)    Leukocytes,Ua LARGE (*)    Bacteria, UA RARE (*)    All other components within normal limits  URINE CULTURE  PREGNANCY, URINE  GC/CHLAMYDIA PROBE AMP ([place]) NOT AT [facility]    EKG None  Radiology No results found.  Procedures Procedures    Medications Ordered in ED Medications - No data to display  ED Course/ Medical Decision Making/ A&P                                 Medical Decision Making Amount and/or Complexity of Data Reviewed Labs: ordered.   This patient presents to the ED with chief complaint(s) of hematuria with pertinent past medical history of STI exposure which further complicates the presenting  complaint. The complaint involves an extensive differential diagnosis and also carries with it a high risk of complications and morbidity.    The differential diagnosis includes UTI, kidney stone, menses  Additional history obtained: Records reviewed Primary Care Documents  ED Course and Reassessment:   Independent labs interpretation:  The following labs were independently interpreted:  UA: Moderate hemoglobin, large leuks Wet prep:Clue cells present hCG urine: Negative GC chlamydia: Pending Urine culture: pending  Consultation: - Consulted or discussed management/test interpretation w/ external professional: None  Consideration for admission or further workup: Considered for admission or further workup however patient's vital signs have been stable throughout her stay.  Patient's physical exam and labs have all been reassuring.  Patient should follow-up with PCP in the next week.        Final Clinical Impression(s) / ED Diagnoses Final diagnoses:  Acute cystitis with hematuria  BV (bacterial vaginosis)    Rx / DC Orders ED Discharge Orders          Ordered    metroNIDAZOLE (FLAGYL) 500 MG tablet  2 times daily        01/29/23 1442              Dolphus Jenny, PA-C 01/29/23 1443    Glyn Ade, MD 01/30/23 0730

## 2023-01-29 NOTE — ED Triage Notes (Signed)
Pt reports:  Blood clots in urine Started this morning Frequent urination Started yesterday Went to UC  Given ABX Has not taken yet Was not given results Cramps Started today

## 2023-01-29 NOTE — Discharge Instructions (Addendum)
Today you were seen for UTI and bacterial vaginosis.  Please pick up your antibiotics prescribed to you by myself and the urgent care and take as prescribed. Please follow-up with your primary care physician in the next week.  Thank you for letting us treat you today. After performing physical exam and reviewing your labs, I feel you are safe to go home. Please follow up with your PCP in the next several days and provide them with your records from this visit. Return to the Emergency Room if pain becomes severe or symptoms worsen.

## 2023-01-30 LAB — GC/CHLAMYDIA PROBE AMP (~~LOC~~) NOT AT ARMC
Chlamydia: NEGATIVE
Comment: NEGATIVE
Comment: NORMAL
Neisseria Gonorrhea: NEGATIVE

## 2023-01-31 LAB — URINE CULTURE: Culture: 60000 — AB

## 2023-02-01 ENCOUNTER — Telehealth: Payer: Self-pay

## 2023-02-01 MED ORDER — NITROFURANTOIN MONOHYD MACRO 100 MG PO CAPS: 100.0000 mg | ORAL_CAPSULE | Freq: Two times a day (BID) | ORAL | 0 refills | Status: AC

## 2023-04-16 ENCOUNTER — Ambulatory Visit
Admission: RE | Admit: 2023-04-16 | Discharge: 2023-04-16 | Disposition: A | Discharge status: Home / Self Care | Payer: Medicaid Other | Source: Ambulatory Visit | Attending: Family Medicine | Admitting: Family Medicine

## 2023-04-16 ENCOUNTER — Other Ambulatory Visit: Payer: Self-pay

## 2023-04-16 VITALS — BP 107/67 | HR 62 | Temp 98.6°F | Resp 16 | Ht 63.0 in | Wt 140.0 lb

## 2023-04-16 DIAGNOSIS — J029 Acute pharyngitis, unspecified: Secondary | ICD-10-CM | POA: Diagnosis not present

## 2023-04-16 DIAGNOSIS — Z20828 Contact with and (suspected) exposure to other viral communicable diseases: Secondary | ICD-10-CM

## 2023-04-16 LAB — POCT INFLUENZA A/B
Influenza A, POC: NEGATIVE
Influenza B, POC: NEGATIVE

## 2023-04-16 MED ORDER — OSELTAMIVIR PHOSPHATE 75 MG PO CAPS: 75.0000 mg | ORAL_CAPSULE | Freq: Two times a day (BID) | ORAL | 0 refills | Status: AC

## 2023-04-16 NOTE — ED Triage Notes (Signed)
Pt presents with complaints of headache, sore throat, generalized body aches x 2 days. Pt currently rates her overall pain as a 4/10. Known exposure to Flu (from son, same household). Denies fevers at home & denies taking OTC medications for symptoms reported.

## 2023-04-16 NOTE — Discharge Instructions (Addendum)
Your flu test today was negative, if you develop worsening symptoms including development of fever, worsening body aches worsening fatigue, runny nose, headache start the Tamiflu

## 2023-04-16 NOTE — ED Provider Notes (Signed)
[facility]    CSN: [id number] Arrival date & time: 04/16/23  1100      History   Chief Complaint Chief Complaint  Patient presents with   Influenza    Entered by patient    HPI Annette Brock is a 23 y.o. female.    Influenza Presenting symptoms: fatigue, headache, myalgias and sore throat   Presenting symptoms: no cough, no diarrhea, no fever, no nausea, no rhinorrhea and no vomiting   Associated symptoms: nasal congestion   Associated symptoms: no chills and no ear pain   Son tested positive for flu yesterday has had a sore throat, headache, body aches for 2 days.  Nasal congestion.  Denies rhinorrhea, ear pain, cough, chest pain, shortness of breath, nausea, vomiting, diarrhea.  No over-the-counter remedies tried.  Did not take her temperature denies feeling feverish  Past Medical History:  Diagnosis Date   Chlamydia infection affecting pregnancy 09/21/2020   COVID-19 02/2019   History of COVID-19 10/03/2020   02/2019    Patient Active Problem List   Diagnosis Date Noted   Visit for routine gyn exam 11/14/2022   Exposure to STD 11/14/2022   Contraception management 01/06/2022   History of C-section 10/03/2020    Past Surgical History:  Procedure Laterality Date   CESAREAN SECTION N/A 03/06/2019   Procedure: CESAREAN SECTION;  Surgeon: Myna Hidalgo, DO;  Location: [facility];  Service: Obstetrics;  Laterality: N/A;    OB History     Gravida  2   Para  2   Term  2   Preterm      AB      Living  2      SAB      IAB      Ectopic      Multiple  0   Live Births  2            Home Medications    Prior to Admission medications   Medication Sig Start Date End Date Taking? Authorizing Provider  metroNIDAZOLE (FLAGYL) 500 MG tablet Take 1 tablet (500 mg total) by mouth 2 (two) times daily. 01/29/23   Dolphus Jenny, PA-C  Burr Medico 150-35 MCG/24HR transdermal patch Place 1 patch onto the skin once a week. 11/14/22    Hermina Staggers, MD    Family History Family History  Problem Relation Age of Onset   Hypertension Mother    Diabetes Mother     Social History Social History   Tobacco Use   Smoking status: Never    Passive exposure: Yes   Smokeless tobacco: Never  Vaping Use   Vaping status: Former   Quit date: 09/03/2020   Substances: Nicotine  Substance Use Topics   Alcohol use: No   Drug use: No     Allergies   Mushroom extract complex (do not select)   Review of Systems Review of Systems  Constitutional:  Positive for fatigue. Negative for appetite change, chills and fever.  HENT:  Positive for congestion and sore throat. Negative for ear pain, rhinorrhea, trouble swallowing and voice change.   Respiratory:  Negative for cough.   Gastrointestinal:  Negative for diarrhea, nausea and vomiting.  Musculoskeletal:  Positive for myalgias.  Neurological:  Positive for headaches.     Physical Exam Triage Vital Signs ED Triage Vitals  Encounter Vitals Group     BP 04/16/23 1107 107/67     Systolic BP Percentile --      Diastolic BP  Percentile --      Pulse Rate 04/16/23 1107 62     Resp 04/16/23 1107 16     Temp 04/16/23 1107 98.6 F (37 C)     Temp Source 04/16/23 1107 Oral     SpO2 04/16/23 1107 100 %     Weight 04/16/23 1117 140 lb (63.5 kg)     Height 04/16/23 1117 5\' 3"  (1.6 m)     Head Circumference --      Peak Flow --      Pain Score 04/16/23 1116 4     Pain Loc --      Pain Education --      Exclude from Growth Chart --    No data found.  Updated Vital Signs BP 107/67 (BP Location: Right Arm)   Pulse 62   Temp 98.6 F (37 C) (Oral)   Resp 16   Ht 5\' 3"  (1.6 m)   Wt 140 lb (63.5 kg)   LMP 03/22/2023 (Exact Date)   SpO2 100%   Breastfeeding No   BMI 24.80 kg/m   Visual Acuity Right Eye Distance:   Left Eye Distance:   Bilateral Distance:    Right Eye Near:   Left Eye Near:    Bilateral Near:     Physical Exam Vitals and nursing note  reviewed.  Constitutional:      Appearance: She is not ill-appearing.  HENT:     Head: Normocephalic and atraumatic.     Right Ear: Tympanic membrane and ear canal normal.     Left Ear: Tympanic membrane normal.     Nose: No rhinorrhea.     Mouth/Throat:     Mouth: Mucous membranes are moist.     Pharynx: Oropharynx is clear. No oropharyngeal exudate or posterior oropharyngeal erythema.  Eyes:     Conjunctiva/sclera: Conjunctivae normal.  Cardiovascular:     Rate and Rhythm: Normal rate and regular rhythm.     Heart sounds: Normal heart sounds.  Pulmonary:     Effort: Pulmonary effort is normal.     Breath sounds: Normal breath sounds.  Musculoskeletal:     Cervical back: Neck supple.  Lymphadenopathy:     Cervical: No cervical adenopathy.  Skin:    General: Skin is warm and dry.  Neurological:     Mental Status: She is alert and oriented to person, place, and time.  Psychiatric:        Mood and Affect: Mood normal.      UC Treatments / Results  Labs (all labs ordered are listed, but only abnormal results are displayed) Labs Reviewed - No data to display  EKG   Radiology No results found.  Procedures Procedures (including critical care time)  Medications Ordered in UC Medications - No data to display  Initial Impression / Assessment and Plan / UC Course  I have reviewed the triage vital signs and the nursing notes.  Pertinent labs & imaging results that were available during my care of the patient were reviewed by me and considered in my medical decision making (see chart for details).     23 year old female with recent flu exposure reports sore throat, fatigue, body aches denies fever, cough, chest pain, shortness of breath. She is well-appearing, afebrile, vital signs are stable, exam is normal.  Point-of-care flu negative OTC meds for symptomatic relief Provide paper prescription for Tamiflu if she develops fever, cough coryza can fill  prescription  Final diagnoses:  None   Discharge Instructions  None    ED Prescriptions   None    PDMP not reviewed this encounter.   Meliton Rattan, [place] 04/16/23 1204

## 2023-05-13 ENCOUNTER — Ambulatory Visit
Admission: RE | Admit: 2023-05-13 | Discharge: 2023-05-13 | Disposition: A | Discharge status: Home / Self Care | Payer: Medicaid Other | Source: Ambulatory Visit | Attending: Family Medicine | Admitting: Family Medicine

## 2023-05-13 VITALS — BP 132/83 | HR 95 | Temp 99.6°F | Resp 18

## 2023-05-13 DIAGNOSIS — B349 Viral infection, unspecified: Secondary | ICD-10-CM | POA: Diagnosis not present

## 2023-05-13 DIAGNOSIS — R051 Acute cough: Secondary | ICD-10-CM | POA: Diagnosis not present

## 2023-05-13 LAB — POC COVID19/FLU A&B COMBO
Covid Antigen, POC: NEGATIVE
Influenza A Antigen, POC: NEGATIVE
Influenza B Antigen, POC: NEGATIVE

## 2023-05-13 NOTE — Discharge Instructions (Addendum)

## 2023-05-13 NOTE — ED Provider Notes (Signed)
 [facility]    CSN: [id number] Arrival date & time: 05/13/23  1433      History   Chief Complaint Chief Complaint  Patient presents with   Cough    Chills/hot flashes Sore throatEar painCongestion Body ache Tight chestShortness of breath - Entered by patient    HPI Annette Brock is a 23 y.o. female.    Cough Not feeling well since last night started with a scratchy throat then developed a cough, body aches, headache, subjective fever with chills and sweats, chest tightness and shortness of breath.  Boyfriend has been sick recently.  Denies confirmed COVID or flu exposures, nausea, vomiting, diarrhea, rashes or skin changes.  Past Medical History:  Diagnosis Date   Chlamydia infection affecting pregnancy 09/21/2020   COVID-19 02/2019   History of COVID-19 10/03/2020   02/2019    Patient Active Problem List   Diagnosis Date Noted   Visit for routine gyn exam 11/14/2022   Exposure to STD 11/14/2022   Contraception management 01/06/2022   History of C-section 10/03/2020    Past Surgical History:  Procedure Laterality Date   CESAREAN SECTION N/A 03/06/2019   Procedure: CESAREAN SECTION;  Surgeon: Myna Hidalgo, DO;  Location: [facility];  Service: Obstetrics;  Laterality: N/A;    OB History     Gravida  2   Para  2   Term  2   Preterm      AB      Living  2      SAB      IAB      Ectopic      Multiple  0   Live Births  2            Home Medications    Prior to Admission medications   Medication Sig Start Date End Date Taking? Authorizing Provider  Burr Medico 150-35 MCG/24HR transdermal patch Place 1 patch onto the skin once a week. 11/14/22   Hermina Staggers, MD    Family History Family History  Problem Relation Age of Onset   Hypertension Mother    Diabetes Mother     Social History Social History   Tobacco Use   Smoking status: Never    Passive exposure: Yes   Smokeless tobacco: Never  Vaping Use   Vaping  status: Former   Quit date: 09/03/2020   Substances: Nicotine  Substance Use Topics   Alcohol use: No   Drug use: No     Allergies   Mushroom extract complex (obsolete)   Review of Systems Review of Systems  Respiratory:  Positive for cough.      Physical Exam Triage Vital Signs ED Triage Vitals [05/13/23 1440]  Encounter Vitals Group     BP 132/83     Systolic BP Percentile      Diastolic BP Percentile      Pulse Rate 95     Resp 18     Temp 99.6 F (37.6 C)     Temp Source Oral     SpO2 97 %     Weight      Height      Head Circumference      Peak Flow      Pain Score 6     Pain Loc      Pain Education      Exclude from Growth Chart    No data found.  Updated Vital Signs BP 132/83 (BP Location: Right Arm)  Pulse 95   Temp 99.6 F (37.6 C) (Oral)   Resp 18   LMP 04/29/2023 (Approximate)   SpO2 97%   Visual Acuity Right Eye Distance:   Left Eye Distance:   Bilateral Distance:    Right Eye Near:   Left Eye Near:    Bilateral Near:     Physical Exam Constitutional:      Appearance: She is not ill-appearing.  HENT:     Head: Normocephalic and atraumatic.     Right Ear: Tympanic membrane and ear canal normal.     Left Ear: Tympanic membrane and ear canal normal.     Nose: Congestion and rhinorrhea (clear) present.  Eyes:     General:        Right eye: No discharge.        Left eye: No discharge.     Conjunctiva/sclera: Conjunctivae normal.  Cardiovascular:     Rate and Rhythm: Normal rate and regular rhythm.     Heart sounds: Normal heart sounds.  Pulmonary:     Effort: Pulmonary effort is normal.     Breath sounds: Normal breath sounds. No wheezing, rhonchi or rales.  Musculoskeletal:     Cervical back: Neck supple.  Lymphadenopathy:     Cervical: No cervical adenopathy.  Neurological:     Mental Status: She is alert and oriented to person, place, and time.  Psychiatric:        Mood and Affect: Mood normal.      UC Treatments /  Results  Labs (all labs ordered are listed, but only abnormal results are displayed) Labs Reviewed  POC COVID19/FLU A&B COMBO    EKG   Radiology No results found.  Procedures Procedures (including critical care time)  Medications Ordered in UC Medications - No data to display  Initial Impression / Assessment and Plan / UC Course  I have reviewed the triage vital signs and the nursing notes.  Pertinent labs & imaging results that were available during my care of the patient were reviewed by me and considered in my medical decision making (see chart for details).     23 year old female with flulike symptoms since last p.m., temp 99.6, has mild nasal congestion and rhinorrhea on exam otherwise exam is normal.  Point-of-care COVID-negative, point-of-care flu negative OTC meds for symptomatic relief Home care and follow-up reviewed with patient Final Clinical Impressions(s) / UC Diagnoses   Final diagnoses:  None   Discharge Instructions   None    ED Prescriptions   None    PDMP not reviewed this encounter.   Meliton Rattan, [place] 05/13/23 1506

## 2023-05-13 NOTE — ED Triage Notes (Signed)
 Pt c/o cough, body aches, headache, sore throat, chest tightness, and SOB since last night. Took Excedrin at 6:30am with no relief.

## 2023-05-17 ENCOUNTER — Encounter (HOSPITAL_COMMUNITY): Payer: Self-pay

## 2023-05-17 ENCOUNTER — Emergency Department (HOSPITAL_COMMUNITY)
Admission: EM | Admit: 2023-05-17 | Discharge: 2023-05-17 | Disposition: A | Discharge status: Home / Self Care | Attending: Emergency Medicine | Admitting: Emergency Medicine

## 2023-05-17 ENCOUNTER — Other Ambulatory Visit: Payer: Self-pay

## 2023-05-17 DIAGNOSIS — I889 Nonspecific lymphadenitis, unspecified: Secondary | ICD-10-CM | POA: Diagnosis not present

## 2023-05-17 DIAGNOSIS — R519 Headache, unspecified: Secondary | ICD-10-CM | POA: Diagnosis present

## 2023-05-17 LAB — MONONUCLEOSIS SCREEN: Mono Screen: NEGATIVE

## 2023-05-17 MED ORDER — AMOXICILLIN-POT CLAVULANATE 875-125 MG PO TABS: 1.0000 | ORAL_TABLET | Freq: Two times a day (BID) | ORAL | 0 refills | Status: DC

## 2023-05-17 MED ORDER — HYDROCODONE-ACETAMINOPHEN 5-325 MG PO TABS
1.0000 | ORAL_TABLET | Freq: Once | ORAL | Status: AC
  Administered 2023-05-17: 1 via ORAL
  Filled 2023-05-17: qty 1

## 2023-05-17 NOTE — Discharge Instructions (Signed)
 You were seen for your neck pain in the emergency department.   At home, please take the Augmentin that we have prescribed you for lymphadenitis.    Check your MyChart online for the results of any tests that had not resulted by the time you left the emergency department.   Follow-up with your primary doctor in 1 week regarding your visit.  If you do not have a primary care doctor you may follow-up with [facility] which is listed in this packet.  Return immediately to the emergency department if you experience any of the following: Difficulty breathing, or any other concerning symptoms.    Thank you for visiting our Emergency Department. It was a pleasure taking care of you today.

## 2023-05-17 NOTE — ED Triage Notes (Signed)
 Pt c.o left sided facial pain for the past several days, denies injury, no fever/chills, denies infected tooth.

## 2023-05-17 NOTE — ED Provider Notes (Signed)
 [place] [facility] DEPART[facility] AT [facility] Provider Note   CSN: [id number] Arrival date & time: 05/17/23  1729     History  Chief Complaint  Patient presents with   Facial Pain    Annette Brock is a 23 y.o. female.  23 year old female previously healthy who presents emergency department with left-sided neck pain and face pain.  Patient reports that for the past 2 days she has been having some congestion and cough.  Also has had some pain on the left side of her neck that radiates up to her cheek.  No vision changes.  No fevers.  No difficulty speaking or swallowing.  Had a COVID and flu test that were negative at urgent care prior to arrival.       Home Medications Prior to Admission medications   Medication Sig Start Date End Date Taking? Authorizing Provider  amoxicillin-clavulanate (AUGMENTIN) 875-125 MG tablet Take 1 tablet by mouth every 12 (twelve) hours. 05/17/23  Yes Rondel Baton, MD  Burr Medico 150-35 MCG/24HR transdermal patch Place 1 patch onto the skin once a week. 11/14/22   Hermina Staggers, MD      Allergies    Mushroom extract complex (obsolete)    Review of Systems   Review of Systems  Physical Exam Updated Vital Signs BP 138/71 (BP Location: Right Arm)   Pulse 98   Temp 98.4 F (36.9 C)   Resp 16   LMP 05/17/2023 (Approximate)   SpO2 100%  Physical Exam Vitals and nursing note reviewed.  Constitutional:      General: She is not in acute distress.    Appearance: She is well-developed.  HENT:     Head: Normocephalic and atraumatic.     Right Ear: Tympanic membrane, ear canal and external ear normal.     Left Ear: Tympanic membrane, ear canal and external ear normal.     Nose: Congestion present.     Mouth/Throat:     Mouth: Mucous membranes are moist.     Pharynx: Posterior oropharyngeal erythema present. No oropharyngeal exudate.     Comments: No dental caries.  No tenderness palpation of the teeth of the left maxilla  or mandible. Eyes:     Extraocular Movements: Extraocular movements intact.     Conjunctiva/sclera: Conjunctivae normal.     Pupils: Pupils are equal, round, and reactive to light.     Comments: Pupils 4 mm bilaterally  Cardiovascular:     Rate and Rhythm: Normal rate and regular rhythm.     Heart sounds: No murmur heard. Pulmonary:     Effort: Pulmonary effort is normal. No respiratory distress.     Breath sounds: Normal breath sounds.  Musculoskeletal:     Cervical back: Normal range of motion and neck supple.  Lymphadenopathy:     Cervical: Cervical adenopathy (Left-sided anterior and posterior) present.  Skin:    General: Skin is warm and dry.  Neurological:     Mental Status: She is alert and oriented to person, place, and time. Mental status is at baseline.  Psychiatric:        Mood and Affect: Mood normal.     ED Results / Procedures / Treatments   Labs (all labs ordered are listed, but only abnormal results are displayed) Labs Reviewed  CULTURE, GROUP A STREP [facility])  MONONUCLEOSIS SCREEN    EKG None  Radiology No results found.  Procedures Procedures    Medications Ordered in ED Medications - No data to display  ED Course/ Medical Decision Making/ A&P                                 Medical Decision Making Amount and/or Complexity of Data Reviewed Labs: ordered.  Risk Prescription drug management.   Annette Brock is a 23 y.o. female previously healthy who presents to the emergency department with left-sided neck pain and left-sided face pain  Initial Ddx:  Lymphadenitis, malignancy, mono, strep throat, URI, PTA, otitis  MDM/Course:  Patient resents emergency department with URI type symptoms and left-sided neck pain.  Does have some lymphadenitis on that side both anteriorly and posteriorly.  No signs of otitis media.  Airway appears to be intact.  No signs of peritonsillar abscess or other deep space infection at this time.  Suspect  that she likely has lymphadenitis which could be viral but may also be bacterial so we will give her some antibiotics at this time.  Counseled her to continue over-the-counter treatment for her URI.  Will send off swabs for mono and strep but since he is will take a long time to come back we will go ahead and empirically treat her with abx and have her fu with her pcp.   This patient presents to the ED for concern of complaints listed in HPI, this involves an extensive number of treatment options, and is a complaint that carries with it a high risk of complications and morbidity. Disposition including potential need for admission considered.   Dispo: DC Home. Return precautions discussed including, but not limited to, those listed in the AVS. Allowed pt time to ask questions which were answered fully prior to dc.  Records reviewed Outpatient Clinic Notes I have reviewed the patients home medications and made adjustments as needed  Portions of this note were generated with Dragon dictation software. Dictation errors may occur despite best attempts at proofreading.     Final Clinical Impression(s) / ED Diagnoses Final diagnoses:  Cervical lymphadenitis    Rx / DC Orders ED Discharge Orders          Ordered    amoxicillin-clavulanate (AUGMENTIN) 875-125 MG tablet  Every 12 hours        05/17/23 1758              Rondel Baton, MD 05/17/23 [phone]

## 2023-05-19 LAB — CULTURE, GROUP A STREP (THRC)

## 2023-06-30 ENCOUNTER — Ambulatory Visit (INDEPENDENT_AMBULATORY_CARE_PROVIDER_SITE_OTHER): Admitting: Obstetrics and Gynecology

## 2023-06-30 ENCOUNTER — Encounter: Payer: Self-pay | Admitting: Obstetrics and Gynecology

## 2023-06-30 ENCOUNTER — Other Ambulatory Visit (HOSPITAL_COMMUNITY)
Admission: RE | Admit: 2023-06-30 | Discharge: 2023-06-30 | Disposition: A | Discharge status: Home / Self Care | Source: Ambulatory Visit | Attending: Obstetrics and Gynecology | Admitting: Obstetrics and Gynecology

## 2023-06-30 VITALS — BP 113/75 | HR 85 | Wt 140.1 lb

## 2023-06-30 DIAGNOSIS — R102 Pelvic and perineal pain: Secondary | ICD-10-CM

## 2023-06-30 NOTE — Progress Notes (Signed)
   GYN EXAM Patient name: Annette [facility] MRN [id number]  Date of birth: 27-Mar-2000 Chief Complaint:   new GYN  History of Present Illness:   Annette Brock is a 23 y.o. [id number] Hispanic female being seen today for left-sided pelvic pain and partner report of lump on cervix.  Patient notes that her periods have been heavier/slightly longer for the past few months. Still regular. No breakthrough bleeding. No urinary symptoms, change in discharge. On Xulane patch. Not missing any doses. Has mild increase in pelvic pressure on left-side of pelvis only during periods. Not lightheaded or dizzy. Denies concern for STI.  Review of Systems:   Pertinent items are noted in HPI Denies any headaches, blurred vision, fatigue, shortness of breath, chest pain, abdominal pain, abnormal vaginal discharge/itching/odor/irritation, problems with periods, bowel movements, urination, or intercourse unless otherwise stated above. Pertinent History Reviewed:  Reviewed past medical,surgical, social and family history.  Reviewed problem list, medications and allergies. Physical Assessment:   Vitals:   06/30/23 1611  BP: 113/75  Pulse: 85  Weight: 140 lb 1 oz (63.5 kg)  Body mass index is 24.81 kg/m.        Physical Examination:   General appearance - well appearing, and in no distress  Mental status - alert, oriented to person, place, and time  Psych:  She has a normal mood and affect  Skin - warm and dry, normal color, no suspicious lesions noted  Pelvic - VULVA: normal appearing vulva with no masses, tenderness or lesions  VAGINA: normal appearing vagina with normal color and discharge, no lesions  CERVIX: normal appearing cervix without discharge or lesions, no CMT  UTERUS: uterus is felt to be normal size, shape, consistency and nontender   ADNEXA: No adnexal masses or tenderness noted.  Chaperone present for exam  No results found for this or any previous visit (from the past 24  hours).   Assessment & Plan:   1. Pelvic pressure in female (Primary) New problem, uncertain diagnosis. Exam today within normal limits without any sign of mass of anomaly. Given increased pelvic pressure during periods, will obtain TVUS for initial work-up. Consider ovarian cyst, fibroid, endometriosis, pelvic congestion syndrome. Will additionally test for STIs. - US  PELVIS TRANSVAGINAL NON-OB (TV ONLY); Future - Cervicovaginal ancillary only( [facility])  Orders Placed This Encounter  Procedures   US  PELVIS TRANSVAGINAL NON-OB (TV ONLY)    Meds: No orders of the defined types were placed in this encounter.   Follow-up: PRN after ultrasound results  Maud Sorenson, MD 06/30/2023 4:43 PM

## 2023-07-02 LAB — CERVICOVAGINAL ANCILLARY ONLY
Bacterial Vaginitis (gardnerella): NEGATIVE
Candida Glabrata: NEGATIVE
Candida Vaginitis: NEGATIVE
Chlamydia: NEGATIVE
Comment: NEGATIVE
Comment: NEGATIVE
Comment: NEGATIVE
Comment: NEGATIVE
Comment: NEGATIVE
Comment: NORMAL
Neisseria Gonorrhea: NEGATIVE
Trichomonas: NEGATIVE

## 2023-07-08 ENCOUNTER — Other Ambulatory Visit (HOSPITAL_COMMUNITY)
Admission: RE | Admit: 2023-07-08 | Discharge: 2023-07-08 | Disposition: A | Discharge status: Home / Self Care | Source: Ambulatory Visit | Attending: Family Medicine | Admitting: Family Medicine

## 2023-07-08 ENCOUNTER — Ambulatory Visit: Admitting: *Deleted

## 2023-07-08 ENCOUNTER — Other Ambulatory Visit: Payer: Self-pay

## 2023-07-08 VITALS — BP 104/62 | HR 72 | Ht 63.0 in | Wt 140.0 lb

## 2023-07-08 DIAGNOSIS — N898 Other specified noninflammatory disorders of vagina: Secondary | ICD-10-CM | POA: Insufficient documentation

## 2023-07-08 NOTE — Progress Notes (Signed)
 Pt presents with c/o green vaginal discharge since yesterday. She denies odor or irritation. Self swab obtained and pt advised she will be notified of results and treatment if needed via Mychart. Pt stated that she is waiting for Pelvic US  to be scheduled which has been ordered. I scheduled the US  for 4/28 @ 3:30 pm.  Pt voiced understanding of all information and instructions given.

## 2023-07-09 LAB — CERVICOVAGINAL ANCILLARY ONLY
Bacterial Vaginitis (gardnerella): NEGATIVE
Candida Glabrata: NEGATIVE
Candida Vaginitis: NEGATIVE
Chlamydia: NEGATIVE
Comment: NEGATIVE
Comment: NEGATIVE
Comment: NEGATIVE
Comment: NEGATIVE
Comment: NEGATIVE
Comment: NORMAL
Neisseria Gonorrhea: NEGATIVE
Trichomonas: NEGATIVE

## 2023-07-13 ENCOUNTER — Ambulatory Visit (HOSPITAL_COMMUNITY)
Admission: RE | Admit: 2023-07-13 | Discharge: 2023-07-13 | Disposition: A | Discharge status: Home / Self Care | Source: Ambulatory Visit | Attending: Obstetrics and Gynecology | Admitting: Obstetrics and Gynecology

## 2023-07-13 DIAGNOSIS — R102 Pelvic and perineal pain: Secondary | ICD-10-CM | POA: Diagnosis present

## 2024-04-05 ENCOUNTER — Ambulatory Visit: Payer: Self-pay | Admitting: Obstetrics and Gynecology

## 2024-04-12 ENCOUNTER — Ambulatory Visit: Admitting: Obstetrics & Gynecology
